# Patient Record
Sex: Female | Born: 1970
Health system: Southern US, Community
[De-identification: ages and names within clinical notes are randomized; demographics above are authoritative.]

## PROBLEM LIST (undated history)

## (undated) DIAGNOSIS — F32A Depression, unspecified: Secondary | ICD-10-CM

## (undated) DIAGNOSIS — M1990 Unspecified osteoarthritis, unspecified site: Secondary | ICD-10-CM

## (undated) DIAGNOSIS — M1610 Unilateral primary osteoarthritis, unspecified hip: Secondary | ICD-10-CM

## (undated) DIAGNOSIS — F329 Major depressive disorder, single episode, unspecified: Secondary | ICD-10-CM

## (undated) HISTORY — PX: JOINT REPLACEMENT: SHX530

## (undated) HISTORY — PX: FRACTURE SURGERY: SHX138

## (undated) HISTORY — PX: ABDOMINAL HYSTERECTOMY: SHX81

## (undated) HISTORY — PX: APPENDECTOMY: SHX54

## (undated) HISTORY — PX: TONSILLECTOMY: SUR1361

---

## 2005-05-19 ENCOUNTER — Other Ambulatory Visit: Payer: Self-pay

## 2005-05-26 ENCOUNTER — Ambulatory Visit: Payer: Self-pay | Admitting: Specialist

## 2005-06-08 ENCOUNTER — Ambulatory Visit: Payer: Self-pay | Admitting: Specialist

## 2006-11-22 ENCOUNTER — Ambulatory Visit: Payer: Self-pay | Admitting: Internal Medicine

## 2006-12-02 ENCOUNTER — Ambulatory Visit: Payer: Self-pay | Admitting: Unknown Physician Specialty

## 2006-12-22 ENCOUNTER — Ambulatory Visit: Payer: Self-pay | Admitting: Unknown Physician Specialty

## 2006-12-27 ENCOUNTER — Ambulatory Visit: Payer: Self-pay | Admitting: Unknown Physician Specialty

## 2007-02-02 ENCOUNTER — Ambulatory Visit: Payer: Self-pay | Admitting: Unknown Physician Specialty

## 2007-02-02 ENCOUNTER — Other Ambulatory Visit: Payer: Self-pay

## 2007-02-02 ENCOUNTER — Ambulatory Visit: Payer: Self-pay | Admitting: Cardiology

## 2007-02-08 ENCOUNTER — Ambulatory Visit: Payer: Self-pay | Admitting: Unknown Physician Specialty

## 2007-02-09 ENCOUNTER — Inpatient Hospital Stay: Payer: Self-pay | Admitting: Unknown Physician Specialty

## 2009-05-27 ENCOUNTER — Emergency Department: Payer: Self-pay

## 2009-08-24 ENCOUNTER — Emergency Department: Payer: Self-pay | Admitting: Emergency Medicine

## 2010-12-31 ENCOUNTER — Ambulatory Visit: Payer: Self-pay | Admitting: Orthopedic Surgery

## 2011-01-14 ENCOUNTER — Ambulatory Visit: Payer: Self-pay | Admitting: Family

## 2011-01-21 ENCOUNTER — Ambulatory Visit: Payer: Self-pay | Admitting: Family

## 2011-02-04 ENCOUNTER — Other Ambulatory Visit: Payer: Self-pay | Admitting: Orthopedic Surgery

## 2011-02-17 ENCOUNTER — Ambulatory Visit: Payer: Self-pay | Admitting: Internal Medicine

## 2011-02-20 ENCOUNTER — Ambulatory Visit: Payer: Self-pay | Admitting: Family

## 2011-03-12 ENCOUNTER — Encounter (HOSPITAL_COMMUNITY): Payer: Self-pay

## 2011-03-23 ENCOUNTER — Ambulatory Visit: Payer: Self-pay | Admitting: Family

## 2011-03-24 ENCOUNTER — Encounter (HOSPITAL_COMMUNITY): Payer: Self-pay

## 2011-03-24 ENCOUNTER — Other Ambulatory Visit: Payer: Self-pay

## 2011-03-24 ENCOUNTER — Encounter (HOSPITAL_COMMUNITY)
Admission: RE | Admit: 2011-03-24 | Discharge: 2011-03-24 | Disposition: A | Discharge status: Home / Self Care | Payer: Medicaid Other | Source: Ambulatory Visit | Attending: Orthopedic Surgery | Admitting: Orthopedic Surgery

## 2011-03-24 HISTORY — DX: Unspecified osteoarthritis, unspecified site: M19.90

## 2011-03-24 LAB — URINALYSIS, ROUTINE W REFLEX MICROSCOPIC
Ketones, ur: NEGATIVE mg/dL
Leukocytes, UA: NEGATIVE
Nitrite: NEGATIVE
Protein, ur: NEGATIVE mg/dL
Urobilinogen, UA: 0.2 mg/dL (ref 0.0–1.0)

## 2011-03-24 LAB — BASIC METABOLIC PANEL
CO2: 25 mEq/L (ref 19–32)
Calcium: 9.4 mg/dL (ref 8.4–10.5)
Creatinine, Ser: 0.81 mg/dL (ref 0.50–1.10)
GFR calc non Af Amer: 90 mL/min — ABNORMAL LOW (ref >90)
Glucose, Bld: 157 mg/dL — ABNORMAL HIGH (ref 70–99)
Sodium: 136 mEq/L (ref 135–145)

## 2011-03-24 LAB — TYPE AND SCREEN

## 2011-03-24 LAB — PROTIME-INR: Prothrombin Time: 13.9 seconds (ref 11.6–15.2)

## 2011-03-24 LAB — APTT: aPTT: 29 seconds (ref 24–37)

## 2011-03-24 LAB — CBC
MCH: 30.7 pg (ref 26.0–34.0)
MCV: 92.5 fL (ref 78.0–100.0)
Platelets: 288 10*3/uL (ref 150–400)
RBC: 4.14 MIL/uL (ref 3.87–5.11)
RDW: 13.2 % (ref 11.5–15.5)

## 2011-03-24 MED ORDER — CHLORHEXIDINE GLUCONATE 4 % EX LIQD: 60.0000 mL | Freq: Once | CUTANEOUS | Status: DC

## 2011-03-24 NOTE — Progress Notes (Signed)
Dr Corky Downs  In [place] is PCP

## 2011-03-24 NOTE — Pre-Procedure Instructions (Signed)
Jessica Richardson  03/24/2011   Your procedure is scheduled on:  03/29/11  Report to [facility] at 1100 AM.  Call this number if you have problems the morning of surgery: [phone]   Remember:   Do not eat food:After Midnight.  May have clear liquids: up to 4 Hours before arrival.  Clear liquids include soda, tea, black coffee, apple or grape juice, broth.  Take these medicines the morning of surgery with A SIP OF WATER: ultram   Do not wear jewelry, make-up or nail polish.  Do not wear lotions, powders, or perfumes. You may wear deodorant.  Do not shave 48 hours prior to surgery.  Do not bring valuables to the hospital.  Contacts, dentures or bridgework may not be worn into surgery.  Leave suitcase in the car. After surgery it may be brought to your room.  For patients admitted to the hospital, checkout time is 11:00 AM the day of discharge.   Patients discharged the day of surgery will not be allowed to drive home.  Name and phone number of your driver: family  Special Instructions: CHG Shower Use Special Wash: 1/2 bottle night before surgery and 1/2 bottle morning of surgery.   Please read over the following fact sheets that you were given: Pain Booklet, Blood Transfusion Information, MRSA Information and Surgical Site Infection Prevention

## 2011-03-29 ENCOUNTER — Encounter (HOSPITAL_COMMUNITY): Payer: Self-pay | Admitting: Anesthesiology

## 2011-03-29 ENCOUNTER — Encounter (HOSPITAL_COMMUNITY): Payer: Self-pay | Admitting: Surgery

## 2011-03-29 ENCOUNTER — Ambulatory Visit (HOSPITAL_COMMUNITY): Payer: Medicaid Other

## 2011-03-29 ENCOUNTER — Encounter (HOSPITAL_COMMUNITY)
Admission: RE | Disposition: A | Discharge status: Skilled Nursing Facility | Payer: Self-pay | Source: Ambulatory Visit | Attending: Orthopedic Surgery

## 2011-03-29 ENCOUNTER — Inpatient Hospital Stay (HOSPITAL_COMMUNITY)
Admission: RE | Admit: 2011-03-29 | Discharge: 2011-04-02 | DRG: 470 | Disposition: A | Discharge status: Skilled Nursing Facility | Payer: Medicaid Other | Source: Ambulatory Visit | Attending: Orthopedic Surgery | Admitting: Orthopedic Surgery

## 2011-03-29 ENCOUNTER — Ambulatory Visit (HOSPITAL_COMMUNITY): Payer: Medicaid Other | Admitting: Anesthesiology

## 2011-03-29 ENCOUNTER — Encounter (HOSPITAL_COMMUNITY): Payer: Self-pay | Admitting: Orthopedic Surgery

## 2011-03-29 DIAGNOSIS — Z01812 Encounter for preprocedural laboratory examination: Secondary | ICD-10-CM

## 2011-03-29 DIAGNOSIS — Z472 Encounter for removal of internal fixation device: Secondary | ICD-10-CM

## 2011-03-29 DIAGNOSIS — M169 Osteoarthritis of hip, unspecified: Principal | ICD-10-CM | POA: Diagnosis present

## 2011-03-29 DIAGNOSIS — Z01810 Encounter for preprocedural cardiovascular examination: Secondary | ICD-10-CM

## 2011-03-29 DIAGNOSIS — E119 Type 2 diabetes mellitus without complications: Secondary | ICD-10-CM | POA: Diagnosis present

## 2011-03-29 DIAGNOSIS — Z6841 Body Mass Index (BMI) 40.0 and over, adult: Secondary | ICD-10-CM

## 2011-03-29 DIAGNOSIS — M1610 Unilateral primary osteoarthritis, unspecified hip: Principal | ICD-10-CM | POA: Diagnosis present

## 2011-03-29 HISTORY — DX: Unilateral primary osteoarthritis, unspecified hip: M16.10

## 2011-03-29 HISTORY — PX: TOTAL HIP ARTHROPLASTY: SHX124

## 2011-03-29 LAB — GLUCOSE, CAPILLARY
Glucose-Capillary: 110 mg/dL — ABNORMAL HIGH (ref 70–99)
Glucose-Capillary: 165 mg/dL — ABNORMAL HIGH (ref 70–99)

## 2011-03-29 SURGERY — ARTHROPLASTY, HIP, TOTAL,POSTERIOR APPROACH
Anesthesia: General | Site: Hip | Laterality: Left | Wound class: Clean

## 2011-03-29 MED ORDER — ONDANSETRON HCL 4 MG/2ML IJ SOLN: 4.0000 mg | Freq: Four times a day (QID) | INTRAMUSCULAR | Status: DC | PRN

## 2011-03-29 MED ORDER — METHOCARBAMOL 500 MG PO TABS
500.0000 mg | ORAL_TABLET | Freq: Four times a day (QID) | ORAL | Status: DC | PRN
  Administered 2011-03-30 – 2011-04-02 (×7): 500 mg via ORAL
  Filled 2011-03-29 (×8): qty 1

## 2011-03-29 MED ORDER — WARFARIN SODIUM 10 MG PO TABS
10.0000 mg | ORAL_TABLET | Freq: Once | ORAL | Status: AC
  Administered 2011-03-29: 10 mg via ORAL
  Filled 2011-03-29: qty 1

## 2011-03-29 MED ORDER — OXYCODONE HCL 5 MG PO TABS
5.0000 mg | ORAL_TABLET | ORAL | Status: DC | PRN
  Administered 2011-03-30: 10 mg via ORAL
  Administered 2011-03-30: 5 mg via ORAL
  Administered 2011-03-30 – 2011-03-31 (×3): 10 mg via ORAL
  Filled 2011-03-29 (×5): qty 2

## 2011-03-29 MED ORDER — POTASSIUM CHLORIDE IN NACL 20-0.45 MEQ/L-% IV SOLN
INTRAVENOUS | Status: DC
  Administered 2011-03-29: 22:00:00 via INTRAVENOUS
  Filled 2011-03-29 (×9): qty 1000

## 2011-03-29 MED ORDER — ACETAMINOPHEN 650 MG RE SUPP: 650.0000 mg | Freq: Four times a day (QID) | RECTAL | Status: DC | PRN

## 2011-03-29 MED ORDER — CEFAZOLIN SODIUM-DEXTROSE 2-3 GM-% IV SOLR
INTRAVENOUS | Status: AC
  Filled 2011-03-29: qty 50

## 2011-03-29 MED ORDER — SENNOSIDES-DOCUSATE SODIUM 8.6-50 MG PO TABS
1.0000 | ORAL_TABLET | Freq: Every evening | ORAL | Status: DC | PRN
  Administered 2011-04-01: 1 via ORAL
  Filled 2011-03-29: qty 1

## 2011-03-29 MED ORDER — WARFARIN VIDEO: Freq: Once | Status: DC

## 2011-03-29 MED ORDER — MENTHOL 3 MG MT LOZG: 1.0000 | LOZENGE | OROMUCOSAL | Status: DC | PRN

## 2011-03-29 MED ORDER — SODIUM CHLORIDE 0.9 % IR SOLN
Status: DC | PRN
  Administered 2011-03-29: 1000 mL

## 2011-03-29 MED ORDER — DEXTROSE 5 % IV SOLN
500.0000 mg | INTRAVENOUS | Status: AC
  Administered 2011-03-29: 500 mg via INTRAVENOUS
  Filled 2011-03-29: qty 5

## 2011-03-29 MED ORDER — HYDROMORPHONE HCL PF 1 MG/ML IJ SOLN
INTRAMUSCULAR | Status: AC
  Filled 2011-03-29: qty 1

## 2011-03-29 MED ORDER — PROPOFOL 10 MG/ML IV EMUL
INTRAVENOUS | Status: DC | PRN
  Administered 2011-03-29: 150 mg via INTRAVENOUS

## 2011-03-29 MED ORDER — MEPERIDINE HCL 25 MG/ML IJ SOLN: 6.2500 mg | INTRAMUSCULAR | Status: DC | PRN

## 2011-03-29 MED ORDER — DIPHENHYDRAMINE HCL 12.5 MG/5ML PO ELIX
12.5000 mg | ORAL_SOLUTION | ORAL | Status: DC | PRN
  Filled 2011-03-29: qty 10

## 2011-03-29 MED ORDER — ZOLPIDEM TARTRATE 5 MG PO TABS: 5.0000 mg | ORAL_TABLET | Freq: Every evening | ORAL | Status: DC | PRN

## 2011-03-29 MED ORDER — OXYCODONE-ACETAMINOPHEN 10-325 MG PO TABS: 1.0000 | ORAL_TABLET | Freq: Four times a day (QID) | ORAL | Status: AC | PRN

## 2011-03-29 MED ORDER — VANCOMYCIN HCL IN DEXTROSE 1-5 GM/200ML-% IV SOLN
1000.0000 mg | Freq: Once | INTRAVENOUS | Status: AC
  Administered 2011-03-30: 1000 mg via INTRAVENOUS
  Filled 2011-03-29 (×2): qty 200

## 2011-03-29 MED ORDER — PATIENT'S GUIDE TO USING COUMADIN BOOK
Freq: Once | Status: AC
  Administered 2011-03-29: 21:00:00
  Filled 2011-03-29: qty 1

## 2011-03-29 MED ORDER — BISACODYL 10 MG RE SUPP: 10.0000 mg | Freq: Every day | RECTAL | Status: DC | PRN

## 2011-03-29 MED ORDER — SENNA 8.6 MG PO TABS
1.0000 | ORAL_TABLET | Freq: Two times a day (BID) | ORAL | Status: DC
  Administered 2011-03-29 – 2011-04-02 (×8): 8.6 mg via ORAL
  Filled 2011-03-29 (×9): qty 1

## 2011-03-29 MED ORDER — ONDANSETRON HCL 4 MG/2ML IJ SOLN
INTRAMUSCULAR | Status: DC | PRN
  Administered 2011-03-29: 4 mg via INTRAVENOUS

## 2011-03-29 MED ORDER — CEFAZOLIN SODIUM 1-5 GM-% IV SOLN: 1.0000 g | INTRAVENOUS | Status: DC

## 2011-03-29 MED ORDER — DEXTROSE 5 % IV SOLN
500.0000 mg | Freq: Four times a day (QID) | INTRAVENOUS | Status: DC | PRN
  Filled 2011-03-29: qty 5

## 2011-03-29 MED ORDER — VANCOMYCIN HCL 1000 MG IV SOLR
1000.0000 mg | INTRAVENOUS | Status: DC | PRN
  Administered 2011-03-29: 1000 mg via INTRAVENOUS

## 2011-03-29 MED ORDER — WARFARIN SODIUM 5 MG PO TABS: 5.0000 mg | ORAL_TABLET | Freq: Every day | ORAL | Status: DC

## 2011-03-29 MED ORDER — PHENOL 1.4 % MT LIQD
1.0000 | OROMUCOSAL | Status: DC | PRN
  Filled 2011-03-29: qty 177

## 2011-03-29 MED ORDER — ENOXAPARIN SODIUM 40 MG/0.4ML ~~LOC~~ SOLN: 40.0000 mg | SUBCUTANEOUS | Status: DC

## 2011-03-29 MED ORDER — CEFAZOLIN SODIUM-DEXTROSE 2-3 GM-% IV SOLR: 2.0000 g | INTRAVENOUS | Status: DC

## 2011-03-29 MED ORDER — OXYCODONE-ACETAMINOPHEN 5-325 MG PO TABS
1.0000 | ORAL_TABLET | ORAL | Status: DC | PRN
  Administered 2011-03-30: 1 via ORAL
  Administered 2011-03-31 – 2011-04-02 (×9): 2 via ORAL
  Filled 2011-03-29 (×5): qty 2
  Filled 2011-03-29: qty 1
  Filled 2011-03-29 (×4): qty 2

## 2011-03-29 MED ORDER — HETASTARCH-ELECTROLYTES 6 % IV SOLN
INTRAVENOUS | Status: DC | PRN
  Administered 2011-03-29: 17:00:00 via INTRAVENOUS

## 2011-03-29 MED ORDER — METHOCARBAMOL 500 MG PO TABS: 500.0000 mg | ORAL_TABLET | Freq: Four times a day (QID) | ORAL | Status: AC

## 2011-03-29 MED ORDER — NEOSTIGMINE METHYLSULFATE 1 MG/ML IJ SOLN
INTRAMUSCULAR | Status: DC | PRN
  Administered 2011-03-29: 3 mg via INTRAVENOUS

## 2011-03-29 MED ORDER — HYDROMORPHONE HCL PF 1 MG/ML IJ SOLN
0.5000 mg | INTRAMUSCULAR | Status: DC | PRN
  Administered 2011-03-29 – 2011-03-31 (×6): 1 mg via INTRAVENOUS
  Filled 2011-03-29 (×5): qty 1

## 2011-03-29 MED ORDER — MORPHINE SULFATE 2 MG/ML IJ SOLN: 0.0500 mg/kg | INTRAMUSCULAR | Status: DC | PRN

## 2011-03-29 MED ORDER — VANCOMYCIN HCL IN DEXTROSE 1-5 GM/200ML-% IV SOLN: 1000.0000 mg | Freq: Two times a day (BID) | INTRAVENOUS | Status: DC

## 2011-03-29 MED ORDER — METOCLOPRAMIDE HCL 10 MG PO TABS: 5.0000 mg | ORAL_TABLET | Freq: Three times a day (TID) | ORAL | Status: DC | PRN

## 2011-03-29 MED ORDER — METOCLOPRAMIDE HCL 5 MG/ML IJ SOLN
5.0000 mg | Freq: Three times a day (TID) | INTRAMUSCULAR | Status: DC | PRN
  Filled 2011-03-29: qty 2

## 2011-03-29 MED ORDER — ACETAMINOPHEN 325 MG PO TABS
650.0000 mg | ORAL_TABLET | Freq: Four times a day (QID) | ORAL | Status: DC | PRN
  Administered 2011-03-31: 650 mg via ORAL
  Filled 2011-03-29: qty 2

## 2011-03-29 MED ORDER — DOCUSATE SODIUM 100 MG PO CAPS
100.0000 mg | ORAL_CAPSULE | Freq: Two times a day (BID) | ORAL | Status: DC
  Administered 2011-03-29 – 2011-04-02 (×8): 100 mg via ORAL
  Filled 2011-03-29 (×11): qty 1

## 2011-03-29 MED ORDER — MIDAZOLAM HCL 5 MG/5ML IJ SOLN
INTRAMUSCULAR | Status: DC | PRN
  Administered 2011-03-29: 2 mg via INTRAVENOUS

## 2011-03-29 MED ORDER — ONDANSETRON HCL 4 MG/2ML IJ SOLN: 4.0000 mg | Freq: Once | INTRAMUSCULAR | Status: DC | PRN

## 2011-03-29 MED ORDER — ENOXAPARIN SODIUM 40 MG/0.4ML ~~LOC~~ SOLN
40.0000 mg | SUBCUTANEOUS | Status: DC
  Administered 2011-03-29 – 2011-04-01 (×4): 40 mg via SUBCUTANEOUS
  Filled 2011-03-29 (×5): qty 0.4

## 2011-03-29 MED ORDER — HYDROMORPHONE HCL PF 1 MG/ML IJ SOLN
0.2500 mg | INTRAMUSCULAR | Status: DC | PRN
  Administered 2011-03-29 (×2): 0.5 mg via INTRAVENOUS

## 2011-03-29 MED ORDER — ERGOCALCIFEROL 1.25 MG (50000 UT) PO CAPS: 50000.0000 [IU] | ORAL_CAPSULE | ORAL | Status: DC

## 2011-03-29 MED ORDER — ONDANSETRON HCL 4 MG PO TABS: 4.0000 mg | ORAL_TABLET | Freq: Four times a day (QID) | ORAL | Status: DC | PRN

## 2011-03-29 MED ORDER — ROCURONIUM BROMIDE 100 MG/10ML IV SOLN
INTRAVENOUS | Status: DC | PRN
  Administered 2011-03-29: 50 mg via INTRAVENOUS
  Administered 2011-03-29: 10 mg via INTRAVENOUS

## 2011-03-29 MED ORDER — LACTATED RINGERS IV SOLN
INTRAVENOUS | Status: DC
  Administered 2011-03-29: 13:00:00 via INTRAVENOUS

## 2011-03-29 MED ORDER — LIDOCAINE HCL (CARDIAC) 20 MG/ML IV SOLN
INTRAVENOUS | Status: DC | PRN
  Administered 2011-03-29: 60 mg via INTRAVENOUS

## 2011-03-29 MED ORDER — LACTATED RINGERS IV SOLN
INTRAVENOUS | Status: DC | PRN
  Administered 2011-03-29 (×2): via INTRAVENOUS

## 2011-03-29 MED ORDER — FENTANYL CITRATE 0.05 MG/ML IJ SOLN
INTRAMUSCULAR | Status: DC | PRN
  Administered 2011-03-29 (×2): 50 ug via INTRAVENOUS
  Administered 2011-03-29: 150 ug via INTRAVENOUS

## 2011-03-29 MED ORDER — VANCOMYCIN HCL IN DEXTROSE 1-5 GM/200ML-% IV SOLN
INTRAVENOUS | Status: AC
  Filled 2011-03-29: qty 200

## 2011-03-29 MED ORDER — BUPIVACAINE HCL 0.5 % IJ SOLN
INTRAMUSCULAR | Status: DC | PRN
  Administered 2011-03-29: 30 mL

## 2011-03-29 MED ORDER — VITAMIN D (ERGOCALCIFEROL) 1.25 MG (50000 UNIT) PO CAPS
50000.0000 [IU] | ORAL_CAPSULE | ORAL | Status: DC
  Administered 2011-03-29: 50000 [IU] via ORAL
  Filled 2011-03-29: qty 1

## 2011-03-29 MED ORDER — GLYCOPYRROLATE 0.2 MG/ML IJ SOLN
INTRAMUSCULAR | Status: DC | PRN
  Administered 2011-03-29: .4 mg via INTRAVENOUS

## 2011-03-29 SURGICAL SUPPLY — 65 items
BENZOIN TINCTURE PRP APPL 2/3 (GAUZE/BANDAGES/DRESSINGS) ×2 IMPLANT
BLADE SAW SAG 73X25 THK (BLADE) ×1
BLADE SAW SGTL 73X25 THK (BLADE) ×1 IMPLANT
BRUSH FEMORAL CANAL (MISCELLANEOUS) IMPLANT
CLOSURE STERI STRIP 1/2 X4 (GAUZE/BANDAGES/DRESSINGS) ×2 IMPLANT
CLOTH BEACON ORANGE TIMEOUT ST (SAFETY) ×2 IMPLANT
COVER BACK TABLE 24X17X13 BIG (DRAPES) IMPLANT
COVER SURGICAL LIGHT HANDLE (MISCELLANEOUS) ×2 IMPLANT
DRAPE INCISE IOBAN 66X45 STRL (DRAPES) ×4 IMPLANT
DRAPE ORTHO SPLIT 77X108 STRL (DRAPES) ×2
DRAPE PROXIMA HALF (DRAPES) ×2 IMPLANT
DRAPE SURG ORHT 6 SPLT 77X108 (DRAPES) ×2 IMPLANT
DRAPE U-SHAPE 47X51 STRL (DRAPES) ×2 IMPLANT
DRILL BIT 5/64 (BIT) ×2 IMPLANT
DRSG MEPILEX BORDER 4X12 (GAUZE/BANDAGES/DRESSINGS) ×2 IMPLANT
DRSG MEPILEX BORDER 4X8 (GAUZE/BANDAGES/DRESSINGS) ×2 IMPLANT
DRSG PAD ABDOMINAL 8X10 ST (GAUZE/BANDAGES/DRESSINGS) ×4 IMPLANT
DURAPREP 26ML APPLICATOR (WOUND CARE) ×2 IMPLANT
ELECT CAUTERY BLADE 6.4 (BLADE) ×2 IMPLANT
ELECT REM PT RETURN 9FT ADLT (ELECTROSURGICAL) ×2
ELECTRODE REM PT RTRN 9FT ADLT (ELECTROSURGICAL) ×1 IMPLANT
EVACUATOR 1/8 PVC DRAIN (DRAIN) IMPLANT
GLOVE BIOGEL PI IND STRL 8 (GLOVE) ×2 IMPLANT
GLOVE BIOGEL PI INDICATOR 8 (GLOVE) ×2
GLOVE ORTHO TXT STRL SZ7.5 (GLOVE) ×4 IMPLANT
GLOVE SURG ORTHO 8.0 STRL STRW (GLOVE) ×4 IMPLANT
GOWN STRL NON-REIN LRG LVL3 (GOWN DISPOSABLE) IMPLANT
HANDPIECE INTERPULSE COAX TIP (DISPOSABLE)
HOOD PEEL AWAY FACE SHEILD DIS (HOOD) ×2 IMPLANT
KIT BASIN OR (CUSTOM PROCEDURE TRAY) ×2 IMPLANT
KIT ROOM TURNOVER OR (KITS) ×2 IMPLANT
MANIFOLD NEPTUNE II (INSTRUMENTS) ×2 IMPLANT
NEEDLE HYPO 25GX1X1/2 BEV (NEEDLE) ×2 IMPLANT
NS IRRIG 1000ML POUR BTL (IV SOLUTION) ×2 IMPLANT
PACK TOTAL JOINT (CUSTOM PROCEDURE TRAY) ×2 IMPLANT
PAD ARMBOARD 7.5X6 YLW CONV (MISCELLANEOUS) ×4 IMPLANT
PILLOW ABDUCTION HIP (SOFTGOODS) ×2 IMPLANT
PRESSURIZER FEMORAL UNIV (MISCELLANEOUS) IMPLANT
RETRIEVER SUT HEWSON (MISCELLANEOUS) ×2 IMPLANT
SET HNDPC FAN SPRY TIP SCT (DISPOSABLE) IMPLANT
SPONGE GAUZE 4X4 12PLY (GAUZE/BANDAGES/DRESSINGS) ×2 IMPLANT
SPONGE LAP 4X18 X RAY DECT (DISPOSABLE) IMPLANT
STRIP CLOSURE SKIN 1/2X4 (GAUZE/BANDAGES/DRESSINGS) ×4 IMPLANT
SUCTION FRAZIER TIP 10 FR DISP (SUCTIONS) ×2 IMPLANT
SUT FIBERWIRE #2 38 REV NDL BL (SUTURE) ×6
SUT FIBERWIRE #2 38 T-5 BLUE (SUTURE)
SUT MNCRL AB 3-0 PS2 18 (SUTURE) ×2 IMPLANT
SUT VIC AB 0 CT1 18XCR BRD 8 (SUTURE) ×2 IMPLANT
SUT VIC AB 0 CT1 27 (SUTURE) ×1
SUT VIC AB 0 CT1 27XBRD ANBCTR (SUTURE) ×1 IMPLANT
SUT VIC AB 0 CT1 8-18 (SUTURE) ×2
SUT VIC AB 1 CT1 27 (SUTURE) ×1
SUT VIC AB 1 CT1 27XBRD ANBCTR (SUTURE) ×1 IMPLANT
SUT VIC AB 2-0 CT1 18 (SUTURE) ×4 IMPLANT
SUT VIC AB 2-0 CT1 27 (SUTURE) ×1
SUT VIC AB 2-0 CT1 TAPERPNT 27 (SUTURE) ×1 IMPLANT
SUT VIC AB 3-0 SH 8-18 (SUTURE) ×6 IMPLANT
SUTURE FIBERWR #2 38 T-5 BLUE (SUTURE) IMPLANT
SUTURE FIBERWR#2 38 REV NDL BL (SUTURE) ×3 IMPLANT
SYR CONTROL 10ML LL (SYRINGE) ×2 IMPLANT
TOWEL OR 17X24 6PK STRL BLUE (TOWEL DISPOSABLE) ×2 IMPLANT
TOWEL OR 17X26 10 PK STRL BLUE (TOWEL DISPOSABLE) ×2 IMPLANT
TOWER CARTRIDGE SMART MIX (DISPOSABLE) IMPLANT
TRAY FOLEY CATH 14FR (SET/KITS/TRAYS/PACK) ×2 IMPLANT
WATER STERILE IRR 1000ML POUR (IV SOLUTION) ×8 IMPLANT

## 2011-03-29 NOTE — Preoperative (Signed)
Beta Blockers   Reason not to administer Beta Blockers:Not Applicable 

## 2011-03-29 NOTE — Anesthesia Postprocedure Evaluation (Signed)
  Anesthesia Post-op Note  Patient: Jessica Richardson  Procedure(s) Performed:  TOTAL HIP ARTHROPLASTY - with removal of retained hardware (knowles pins)  Patient Location: PACU  Anesthesia Type: General  Level of Consciousness: awake and alert   Airway and Oxygen Therapy: Patient connected to nasal cannula oxygen  Post-op Pain: moderate  Post-op Assessment: Post-op Vital signs reviewed and Patient's Cardiovascular Status Stable  Post-op Vital Signs: stable  Complications: No apparent anesthesia complications

## 2011-03-29 NOTE — H&P (Signed)
  PREOPERATIVE H&P  Chief Complaint: DJD Left Hip Retained Hardware   HPI: Jessica Richardson is a 41 y.o. female who presents for preoperative history and physical with a diagnosis of DJD Left Hip and Retained Hardware . Symptoms are rated as moderate to severe, and have been worsening.  This is significantly impairing activities of daily living.  She has elected for surgical management. She previously had a slipped capital femoral epiphysis, with internal fixation using Knowles pins when she was young girl. She has had progressive deformity and dysfunction with progressive loss of function of her left hip. She wished for total hip arthroplasty.  Past Medical History  Diagnosis Date  . Arthritis   . Diabetes mellitus     diet conttrolled  only,no meds ever   Past Surgical History  Procedure Date  . Abdominal hysterectomy   . Appendectomy   . Tonsillectomy   . Cesarean section   . Joint replacement     pins both hips   History   Social History  . Marital Status: Single    Spouse Name: N/A    Number of Children: N/A  . Years of Education: N/A   Social History Main Topics  . Smoking status: Former Smoker    Quit date: 03/24/2011  . Smokeless tobacco: None  . Alcohol Use: Yes     rarely  . Drug Use: No  . Sexually Active: No   Other Topics Concern  . None   Social History Narrative  . None   History reviewed. No pertinent family history. Allergies  Allergen Reactions  . Penicillins Hives   Prior to Admission medications   Medication Sig Start Date End Date Taking? Authorizing Provider  ergocalciferol (VITAMIN D2) 50000 UNITS capsule Take 50,000 Units by mouth every 30 (thirty) days.     Yes Historical Provider, MD  meloxicam (MOBIC) 15 MG tablet Take 15 mg by mouth daily.     Yes Historical Provider, MD  traMADol (ULTRAM) 50 MG tablet Take 50 mg by mouth daily as needed. For pain. Maximum dose= 8 tablets per day    Yes Historical Provider, MD     Positive ROS: All  other systems have been reviewed and were otherwise negative with the exception of those mentioned in the HPI and as above.  Physical Exam: General: Alert, no acute distress, obese Cardiovascular: No pedal edema Respiratory: No cyanosis, no use of accessory musculature GI: No organomegaly, abdomen is soft and non-tender Skin: No lesions in the area of chief complaint Neurologic: Sensation intact distally Psychiatric: Patient is competent for consent with normal mood and affect Lymphatic: No axillary or cervical lymphadenopathy  MUSCULOSKELETAL: Left hip has severe limitation with range of motion. Her previous surgical wounds have healed. She has reproduction of her pain with passive motion of her hip. She has normal no internal or external rotation.  Assessment: DJD Left Hip Retained Hardware   Plan: Plan for Procedure(s): TOTAL HIP ARTHROPLASTY with removal of hardware  The risks benefits and alternatives were discussed with the patient including but not limited to the risks of nonoperative treatment, versus surgical intervention including infection, bleeding, nerve injury, periprosthetic fracture, the need for revision surgery, dislocation, leg length discrepancy, blood clots, cardiopulmonary complications, morbidity, mortality, among others, and they were willing to proceed.  Predicted outcome is good, although there will be at least a six to nine month expected recovery.    Corrado Hymon P 03/29/2011 1:50 PM

## 2011-03-29 NOTE — Anesthesia Procedure Notes (Addendum)
Procedure Name: Intubation Date/Time: 03/29/2011 2:08 PM Performed by: Caryn Bee Pre-anesthesia Checklist: Patient identified, Emergency Drugs available, Suction available, Patient being monitored and Timeout performed Patient Re-evaluated:Patient Re-evaluated prior to inductionOxygen Delivery Method: Circle System Utilized Preoxygenation: Pre-oxygenation with 100% oxygen Intubation Type: IV induction Ventilation: Oral airway inserted - appropriate to patient size and Mask ventilation without difficulty Laryngoscope Size: Miller and 2 Grade View: Grade II Tube type: Oral Tube size: 7.5 mm Number of attempts: 1 Airway Equipment and Method: stylet Placement Confirmation: ETT inserted through vocal cords under direct vision,  positive ETCO2 and breath sounds checked- equal and bilateral Secured at: 23 cm Tube secured with: Tape Dental Injury: Teeth and Oropharynx as per pre-operative assessment  Comments: Intubation per Elvina Sidle, SRNA.

## 2011-03-29 NOTE — Progress Notes (Signed)
ANTICOAGULATION CONSULT NOTE - Initial Consult  Pharmacy Consult for Coumadin Indication: VTE prophylaxis s/p hip  Allergies  Allergen Reactions  . Penicillins Hives    Patient Measurements: Height: 5\' 5"  (165.1 cm) Weight: 319 lb 10.7 oz (145 kg) IBW/kg (Calculated) : 57  Adjusted Body Weight: Vital Signs: Temp: 97.7 F (36.5 C) (01/07 1949) Temp src: Oral (01/07 1119) BP: 133/73 mmHg (01/07 1949) Pulse Rate: 82  (01/07 1949)  Labs: No results found for this basename: HGB:2,HCT:3,PLT:3,APTT:3,LABPROT:3,INR:3,HEPARINUNFRC:3,CREATININE:3,CKTOTAL:3,CKMB:3,TROPONINI:3 in the last 72 hours Estimated Creatinine Clearance: 134.4 ml/min (by C-G formula based on Cr of 0.81).  Medical History: Past Medical History  Diagnosis Date  . Arthritis   . Diabetes mellitus     diet conttrolled  only,no meds ever  . Hip arthritis, left, post-SCFE 03/29/2011    Medications:  Scheduled:    . ceFAZolin      . docusate sodium  100 mg Oral BID  . enoxaparin  40 mg Subcutaneous Q24H  . ergocalciferol  50,000 Units Oral Q30 days  . HYDROmorphone      . methocarbamol(ROBAXIN) IV  500 mg Intravenous To PACU  . senna  1 tablet Oral BID  . vancomycin  1,000 mg Intravenous Once  . DISCONTD: ceFAZolin (ANCEF) IV  1 g Intravenous 60 min Pre-Op  . DISCONTD: ceFAZolin (ANCEF) IV  2 g Intravenous 60 min Pre-Op  . DISCONTD: vancomycin  1,000 mg Intravenous Q12H    Assessment: 41 year old starting Coumadin for VTE prophylaxis  Goal of Therapy:  INR 2-3   Plan:  1) Coumadin 10 mg po x 1 dose tonight 2) Daily PT / INR 3) Coumadin education  Thank you.  Jessica Richardson 03/29/2011,8:31 PM

## 2011-03-29 NOTE — Progress Notes (Signed)
Dr. Dion Saucier notified of hives with penicillin. Stated to do test dose with ancef.

## 2011-03-29 NOTE — Transfer of Care (Signed)
Immediate Anesthesia Transfer of Care Note  Patient: Jessica Richardson  Procedure(s) Performed:  TOTAL HIP ARTHROPLASTY - with removal of retained hardware (knowles pins)  Patient Location: PACU  Anesthesia Type: General  Level of Consciousness: awake, alert , oriented and patient cooperative  Airway & Oxygen Therapy: Patient Spontanous Breathing and Patient connected to nasal cannula oxygen  Post-op Assessment: Report given to PACU RN, Post -op Vital signs reviewed and stable and Patient moving all extremities  Post vital signs: Reviewed and stable  Complications: No apparent anesthesia complications

## 2011-03-29 NOTE — Op Note (Signed)
03/29/2011  5:05 PM  PATIENT:  Jessica Richardson   MRN: [id number]  PRE-OPERATIVE DIAGNOSIS:  degenerative joint disease Left Hip, with Retained Hardware , status post slipped lateral femoral epiphysis  POST-OPERATIVE DIAGNOSIS:  degenerative joint disease left hip, with retained hardware, status post slipped capital femoral epiphysis  PROCEDURE:  Procedure(s): TOTAL HIP ARTHROPLASTY left, with removal of 5 knowles pins  PREOPERATIVE INDICATIONS:    Jessica Richardson is an 41 y.o. female who has a diagnosis of Hip arthritis and elected for surgical management after failing conservative treatment.  The risks benefits and alternatives were discussed with the patient including but not limited to the risks of nonoperative treatment, versus surgical intervention including infection, bleeding, nerve injury, periprosthetic fracture, the need for revision surgery, dislocation, leg length discrepancy, blood clots, cardiopulmonary complications, morbidity, mortality, among others, and they were willing to proceed.  Predicted outcome is good, although there will be at least a six to nine month expected recovery.   OPERATIVE REPORT     SURGEON:  Teryl Lucy, MD    ASSISTANT:  Janace Litten, OPA-C  (Present throughout the entire procedure,  necessary for completion of procedure in a timely manner, assisting with retraction, instrumentation, and closure)     ANESTHESIA:  General    COMPLICATIONS:  None.     COMPONENTS:  [employer] fit femur size 4 with a 36 +1.5 mm head ball and a pinnacle acetabular shell size 52 with a 10 degree lipped polyethylene liner    PROCEDURE IN DETAIL:   The patient was met in the holding area and  identified.  The appropriate hip was identified and marked at the operative site.  The patient was then transported to the OR  and  placed under general l anesthesia.  At that point, the patient was  placed in the lateral decubitus position with the operative side up and   secured to the operating room table and all bony prominences padded.     The operative lower extremity was prepped from the iliac crest to the distal leg.  Sterile draping was performed.  Time out was performed prior to incision.      A routine posterolateral approach was utilized via sharp dissection  carried down to the subcutaneous tissue.  Gross bleeders were Bovie coagulated.  The iliotibial band was identified and incised along the length of the skin incision.  Self-retaining retractors were  inserted.    A osteotome was used to uncover the Knowles pins, and these were removed. This was fairly difficult, and there was substantial bony overgrowth. Nonetheless I was able to remove all of the pins, and I replaced 1 pain in order to help stabilize the neck during dislocation.  With the hip internally rotated, the short external rotators  were identified. The piriformis and capsule was tagged with FiberWire, and the hip capsule released in a T-type fashion.  The femoral neck was exposed, I removed the Knowles pin, and I resected the femoral neck using the appropriate jig. This was performed at approximately a one half thumb's breadth above the lesser trochanter. I made this cut as proximal as possible, and I had no neck on the head segment left, but the neck itself was extremely short secondary to her deformity.    I then exposed the deep acetabulum, cleared out any tissue including the ligamentum teres.  A wing retractor was placed.  After adequate visualization, I excised the labrum, and then sequentially reamed.  I placed the  trial acetabulum, which seated nicely, and then impacted the real cup into place.  Appropriate version and inclination was confirmed clinically matching their bony anatomy, and also with the use of the jig.  A trial polyethylene liner was placed and the wing retractor removed.    I then prepared the proximal femur using the cookie-cutter, the lateralizing reamer, and then  sequentially reamed and broached.  A trial broach, neck, and head was utilized, and I reduced the hip and it was found to have excellent stability with functional range of motion. The trial components were then removed, and the real polyethylene liner was placed with the lip directed posteriorly.  I then impacted the real femoral prosthesis into place into the appropriate version, slightly anteverted to the normal anatomy, and I impacted the real head ball into place. The hip was then reduced and taken through functional range of motion and found to have excellent stability. Leg lengths were restored.  I then used a 2 mm drill bits to pass the FiberWire suture from the capsule and puriform is through the greater trochanter, and secured this. Excellent posterior capsular repair was achieved. I also closed the T in the capsule. The area over the Knowles pins on the vastus lateralis was repaired with Vicryl.  I then irrigated the hip copiously again with pulse lavage, and repaired the fascia with Vicryl, followed by Vicryl for the subcutaneous tissue, Monocryl for the skin, Steri-Strips and sterile gauze. The wounds were injected. The patient was then awakened and returned to PACU in stable and satisfactory condition. There were no complications.  Teryl Lucy, MD Orthopedic Surgeon [phone]   03/29/2011 5:05 PM

## 2011-03-29 NOTE — Anesthesia Preprocedure Evaluation (Addendum)
Anesthesia Evaluation  Patient identified by MRN, date of birth, ID band Patient awake    Reviewed: Allergy & Precautions, H&P , NPO status , Patient's Chart, lab work & pertinent test results  Airway Mallampati: I TM Distance: >3 FB Neck ROM: Full    Dental  (+) Edentulous Upper and Dental Advisory Given   Pulmonary  clear to auscultation        Cardiovascular Regular Normal    Neuro/Psych    GI/Hepatic   Endo/Other  Morbid obesity  Renal/GU      Musculoskeletal   Abdominal   Peds  Hematology   Anesthesia Other Findings   Reproductive/Obstetrics                           Anesthesia Physical Anesthesia Plan  ASA: III  Anesthesia Plan: General   Post-op Pain Management:    Induction: Intravenous  Airway Management Planned: Oral ETT  Additional Equipment:   Intra-op Plan:   Post-operative Plan: Extubation in OR  Informed Consent: I have reviewed the patients History and Physical, chart, labs and discussed the procedure including the risks, benefits and alternatives for the proposed anesthesia with the patient or authorized representative who has indicated his/her understanding and acceptance.   Dental advisory given  Plan Discussed with: CRNA, Anesthesiologist and Surgeon  Anesthesia Plan Comments:         Anesthesia Quick Evaluation

## 2011-03-30 ENCOUNTER — Encounter (HOSPITAL_COMMUNITY): Payer: Self-pay | Admitting: Orthopedic Surgery

## 2011-03-30 LAB — GLUCOSE, CAPILLARY

## 2011-03-30 LAB — CBC
Hemoglobin: 9.9 g/dL — ABNORMAL LOW (ref 12.0–15.0)
MCH: 30.4 pg (ref 26.0–34.0)
MCHC: 32.4 g/dL (ref 30.0–36.0)
MCV: 93.9 fL (ref 78.0–100.0)
RBC: 3.26 MIL/uL — ABNORMAL LOW (ref 3.87–5.11)

## 2011-03-30 LAB — BASIC METABOLIC PANEL
BUN: 10 mg/dL (ref 6–23)
CO2: 25 mEq/L (ref 19–32)
Calcium: 8.2 mg/dL — ABNORMAL LOW (ref 8.4–10.5)
Creatinine, Ser: 0.63 mg/dL (ref 0.50–1.10)
GFR calc non Af Amer: >90 mL/min (ref >90)
Glucose, Bld: 109 mg/dL — ABNORMAL HIGH (ref 70–99)

## 2011-03-30 MED ORDER — MUPIROCIN 2 % EX OINT
TOPICAL_OINTMENT | Freq: Two times a day (BID) | CUTANEOUS | Status: DC
  Administered 2011-03-30 – 2011-04-02 (×8): via NASAL
  Filled 2011-03-30: qty 22

## 2011-03-30 MED ORDER — WARFARIN SODIUM 10 MG PO TABS
10.0000 mg | ORAL_TABLET | Freq: Once | ORAL | Status: AC
  Administered 2011-03-30: 10 mg via ORAL
  Filled 2011-03-30: qty 1

## 2011-03-30 MED ORDER — CHLORHEXIDINE GLUCONATE CLOTH 2 % EX PADS
6.0000 | MEDICATED_PAD | Freq: Every day | CUTANEOUS | Status: DC
  Administered 2011-03-30 – 2011-04-02 (×2): 6 via TOPICAL

## 2011-03-30 MED ORDER — INSULIN ASPART 100 UNIT/ML ~~LOC~~ SOLN
0.0000 [IU] | Freq: Three times a day (TID) | SUBCUTANEOUS | Status: DC
  Administered 2011-03-30 – 2011-04-01 (×5): 2 [IU] via SUBCUTANEOUS
  Administered 2011-04-01: 3 [IU] via SUBCUTANEOUS
  Administered 2011-04-02 (×2): 2 [IU] via SUBCUTANEOUS
  Filled 2011-03-30: qty 3

## 2011-03-30 NOTE — Progress Notes (Signed)
Physical Therapy Treatment Patient Details Name: Jessica Richardson MRN: [id number] DOB: 1970/10/02 Today's Date: 03/30/2011  PT Assessment/Plan  PT - Assessment/Plan Comments on Treatment Session: Pt moving very slowly this afternoon, requiring encouragement for initiating movement, but overall moves well once up from sitting.  PT Plan: Discharge plan remains appropriate PT Frequency: 7X/week Follow Up Recommendations: Skilled nursing facility Equipment Recommended: None recommended by PT;Defer to next venue PT Goals  Acute Rehab PT Goals PT Goal Formulation: With patient/family Time For Goal Achievement: 7 days Pt will go Supine/Side to Sit: with min assist PT Goal: Supine/Side to Sit - Progress:  (Not addressed) Pt will go Sit to Supine/Side: with min assist PT Goal: Sit to Supine/Side - Progress: Progressing toward goal Pt will go Sit to Stand: with min assist;with upper extremity assist PT Goal: Sit to Stand - Progress: Progressing toward goal Pt will go Stand to Sit: with min assist;with upper extremity assist PT Goal: Stand to Sit - Progress: Progressing toward goal Pt will Ambulate: with min assist;with rolling walker;16 - 50 feet (62ft, 25% weight bearing) PT Goal: Ambulate - Progress: Progressing toward goal Pt will Perform Home Exercise Program: with supervision, verbal cues required/provided PT Goal: Perform Home Exercise Program - Progress: Progressing toward goal  PT Treatment Precautions/Restrictions  Precautions Precautions: Posterior Hip Precaution Booklet Issued: No Precaution Comments: Pt able to recall 1/3 precautiond and WB status Required Braces or Orthoses: No Restrictions Weight Bearing Restrictions: Yes LLE Weight Bearing: Partial weight bearing LLE Partial Weight Bearing Percentage or Pounds: 25% Mobility (including Balance) Bed Mobility Bed Mobility: Yes Sit to Supine: 2: Max assist;HOB flat;With rail Sit to Supine - Details (indicate cue type and  reason): Cues for sequence and technique, A for bil LEs.  Transfers Transfers: Yes Sit to Stand: 1: +2 Total assist;Patient percentage (comment);From chair/3-in-1;With upper extremity assist (Pt 40%) Sit to Stand Details (indicate cue type and reason): Cues for scooting edge of chair and for initiation, increased time due to pain Stand to Sit: 3: Mod assist;To chair/3-in-1;With upper extremity assist;To bed Stand to Sit Details: Cues for controling descent Ambulation/Gait Ambulation/Gait: Yes Ambulation/Gait Assistance: 3: Mod assist Ambulation/Gait Assistance Details (indicate cue type and reason): Cues for posture,A for steadying Ambulation Distance (Feet): 20 Feet Assistive device: Rolling walker Gait Pattern: Step-to pattern;Decreased stance time - left;Decreased step length - right;Decreased weight shift to left Stairs: No    Exercise  Total Joint Exercises Ankle Circles/Pumps: 10 reps;Seated;Both;Strengthening;AROM Gluteal Sets: 10 reps;Supine;Both;Strengthening;AROM Heel Slides: 10 reps;Supine;Left;Strengthening;AAROM Hip ABduction/ADduction: Supine;10 reps;Left;Strengthening;AAROM Long Arc Quad: Seated;10 reps;Left;Strengthening;AROM End of Session PT - End of Session Equipment Utilized During Treatment: Gait belt Activity Tolerance: Patient limited by pain Patient left: in chair;with call bell in reach;with family/visitor present Nurse Communication: Mobility status for transfers;Mobility status for ambulation;Weight bearing status General Behavior During Session: [facility] for tasks performed Cognition: [facility] for tasks performed  [facility] [place], [place] [phone]  03/30/2011, 2:42 PM

## 2011-03-30 NOTE — Progress Notes (Signed)
Utilization review completed. Harutyun Monteverde, RN, BSN. 03/30/11  

## 2011-03-30 NOTE — Progress Notes (Signed)
Occupational Therapy Evaluation Patient Details Name: Jessica Richardson MRN: [id number] DOB: 1970/05/06 Today's Date: 03/30/2011  Problem List:  Patient Active Problem List  Diagnoses  . Hip arthritis, left, post-SCFE    Past Medical History:  Past Medical History  Diagnosis Date  . Arthritis   . Diabetes mellitus     diet conttrolled  only,no meds ever  . Hip arthritis, left, post-SCFE 03/29/2011   Past Surgical History:  Past Surgical History  Procedure Date  . Abdominal hysterectomy   . Appendectomy   . Tonsillectomy   . Cesarean section   . Joint replacement     pins both hips  . Total hip arthroplasty 03/29/2011    Procedure: TOTAL HIP ARTHROPLASTY;  Surgeon: Eulas Post;  Location: [facility] OR;  Service: Orthopedics;  Laterality: Left;  with removal of retained hardware (knowles pins)    OT Assessment/Plan/Recommendation OT Assessment Clinical Impression Statement: Pt s/p L THA. Pt will benefit from skilled OT services to maximize independence with ADL and functional mobility for ADL with AE and ADE following THP (post) and PWB status of 24% to facilitate D/C to next venue of care. Pt states that she is going to rehab at [facility]. OT Recommendation/Assessment: Patient will need skilled OT in the acute care venue OT Problem List: Decreased strength;Decreased range of motion;Decreased knowledge of use of DME or AE;Decreased knowledge of precautions;Pain Barriers to Discharge: Decreased caregiver support;Inaccessible home environment OT Therapy Diagnosis : Generalized weakness OT Plan OT Frequency: Min 1X/week OT Treatment/Interventions: Self-care/ADL training;Energy conservation;DME and/or AE instruction;Therapeutic activities;Patient/family education OT Recommendation Follow Up Recommendations: Skilled nursing facility Equipment Recommended: Defer to next venue Individuals Consulted Consulted and Agree with Results and Recommendations: Patient;Family member/caregiver OT  Goals Acute Rehab OT Goals OT Goal Formulation: With patient Time For Goal Achievement: 7 days ADL Goals Pt Will Perform Lower Body Bathing: with supervision;Sit to stand from bed;with adaptive equipment;with cueing (comment type and amount) ADL Goal: Lower Body Bathing - Progress: Not met Pt Will Perform Lower Body Dressing: with supervision;Sit to stand from bed;with adaptive equipment;with cueing (comment type and amount) ADL Goal: Lower Body Dressing - Progress: Not met Pt Will Transfer to Toilet: with supervision;with DME;Extra wide 3-in-1;Maintaining hip precautions;with cueing (comment type and amount) ADL Goal: Toilet Transfer - Progress: Not met Pt Will Perform Toileting - Clothing Manipulation: Independently ADL Goal: Toileting - Clothing Manipulation - Progress: Met Pt Will Perform Toileting - Hygiene: Independently ADL Goal: Toileting - Hygiene - Progress: Met Miscellaneous OT Goals Miscellaneous OT Goal #1: State 3/3 THP independently  OT Goal: Miscellaneous Goal #1 - Progress: Progressing toward goals  OT Evaluation Precautions/Restrictions  Precautions Precautions: Posterior Hip Precaution Booklet Issued: No Precaution Comments: Pt able to recall 1/3 precautiond and WB status Required Braces or Orthoses: No Restrictions Weight Bearing Restrictions: Yes LLE Weight Bearing: Partial weight bearing LLE Partial Weight Bearing Percentage or Pounds: 25% Prior Functioning Home Living Lives With: Sheran Spine Help From: Family Type of Home: House Home Layout: One level Home Access: Stairs to enter Entrance Stairs-Rails: None Entrance Stairs-Number of Steps: 3 Bathroom Shower/Tub: Engineer, manufacturing systems: Standard Bathroom Accessibility: No Home Adaptive Equipment: Bedside commode/3-in-1;Walker - rolling Prior Function Level of Independence: Independent with basic ADLs;Independent with homemaking with ambulation;Independent with gait;Independent with  transfers Able to Take Stairs?: Yes Driving: Yes ADL ADL Eating/Feeding: Independent Grooming: Set up Where Assessed - Grooming: Sitting, bed Upper Body Bathing: Chest;Right arm;Left arm;Abdomen;Simulated;Set up Where Assessed - Upper Body Bathing: Sitting, bed Lower Body  Bathing: Maximal assistance;Simulated;Performed Where Assessed - Lower Body Bathing: Sitting, chair Upper Body Dressing: Set up Where Assessed - Upper Body Dressing: Sitting, bed Lower Body Dressing: Performed;Moderate assistance Where Assessed - Lower Body Dressing: Sit to stand from bed Toilet Transfer: Minimal assistance;Performed Toilet Transfer Method: Stand pivot;Ambulating Acupuncturist: Extra wide bedside commode;Drop arm bedside commode Toileting - Clothing Manipulation: Independent Where Assessed - Glass blower/designer Manipulation: Standing Toileting - Hygiene: Independent Where Assessed - Toileting Hygiene: Standing Tub/Shower Transfer: Not assessed Equipment Used: Long-handled shoe horn;Long-handled sponge;Reacher;Rolling walker;Sock aid ADL Comments: Min A   Cognition Cognition Overall Cognitive Status: Appears within functional limits for tasks assessed Orientation Level: Oriented X4   Extremity Assessment RUE Assessment RUE Assessment: Within Functional Limits LUE Assessment LUE Assessment: Within Functional Limits Mobility  Bed Mobility Bed Mobility: Yes Sit to Supine: 3: Mod assist Transfers Transfers: Yes Sit to Stand: 4: Min assist;With armrests;From bed;From chair/3-in-1 Stand to Sit: 4: Min assist;With upper extremity assist;To bed;To chair/3-in-1   End of Session OT - End of Session Equipment Utilized During Treatment: Gait belt Activity Tolerance: Patient tolerated treatment well Patient left: in bed;with call bell in reach;with family/visitor present Nurse Communication: Mobility status for transfers;Weight bearing status General Behavior During Session: [facility] for  tasks performed Cognition: [facility] for tasks performed   [facility] 03/30/2011, 7:32 PM  [facility], OTR/L  [phone] 03/30/2011

## 2011-03-30 NOTE — Progress Notes (Signed)
Physical Therapy Evaluation Patient Details Name: Jessica Richardson MRN: [id number] DOB: 04-25-1970 Today's Date: 03/30/2011  Problem List:  Patient Active Problem List  Diagnoses  . Hip arthritis, left, post-SCFE    Past Medical History:  Past Medical History  Diagnosis Date  . Arthritis   . Diabetes mellitus     diet conttrolled  only,no meds ever  . Hip arthritis, left, post-SCFE 03/29/2011   Past Surgical History:  Past Surgical History  Procedure Date  . Abdominal hysterectomy   . Appendectomy   . Tonsillectomy   . Cesarean section   . 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 Pt currently with cute pain, decreased strength, and ROM, limiting gait and functional mobility. Pt will benefit from skilled PT to address these deficits and reduce falls risk and caregiver burden at next venue of care for eventual return home.   PT Recommendation/Assessment: Patient will need skilled PT in the acute care venue PT Problem List: Decreased strength;Decreased range of motion;Decreased activity tolerance;Decreased balance;Decreased mobility;Decreased knowledge of use of DME;Decreased safety awareness;Decreased knowledge of precautions;Pain;Obesity Barriers to Discharge: Inaccessible home environment PT Therapy Diagnosis : Difficulty walking;Acute pain PT Plan PT Frequency: 7X/week PT Treatment/Interventions: DME instruction;Gait training;Stair training;Functional mobility training;Therapeutic activities;Therapeutic exercise;Balance training;Patient/family education PT Recommendation Follow Up Recommendations: Skilled nursing facility Equipment Recommended: None recommended by PT  PT Goals  Acute Rehab PT Goals PT Goal Formulation: With patient/family Time For Goal Achievement: 7 days Pt will go Supine/Side to Sit: with min assist PT Goal:  Supine/Side to Sit - Progress:  (Goal set) Pt will go Sit to Supine/Side: with min assist PT Goal: Sit to Supine/Side - Progress:  (Goal set) Pt will go Sit to Stand: with min assist;with upper extremity assist PT Goal: Sit to Stand - Progress: Progressing toward goal Pt will go Stand to Sit: with min assist;with upper extremity assist PT Goal: Stand to Sit - Progress: Progressing toward goal Pt will Ambulate: with min assist;with rolling walker;16 - 50 feet (65ft, 25% weight bearing) PT Goal: Ambulate - Progress: Progressing toward goal Pt will Perform Home Exercise Program: with supervision, verbal cues required/provided PT Goal: Perform Home Exercise Program - Progress: Progressing toward goal  PT Evaluation Precautions/Restrictions  Precautions Precautions: Posterior Hip Precaution Booklet Issued: No Precaution Comments: Educated pt on 3/3 hip precautions Required Braces or Orthoses: No Restrictions Weight Bearing Restrictions: Yes LLE Weight Bearing: Partial weight bearing LLE Partial Weight Bearing Percentage or Pounds: 25% Prior Functioning  Home Living Lives With: Son (2 sons, 43 and 18 yrs old) Receives Help From: Family Type of Home: House Home Layout: One level Home Access: Stairs to enter Entrance Stairs-Rails: None Secretary/administrator of Steps: 3 Bathroom Shower/Tub: Engineer, manufacturing systems: Standard Bathroom Accessibility: No Home Adaptive Equipment: Bedside commode/3-in-1;Walker - rolling Prior Function Level of Independence: Independent with basic ADLs;Independent with homemaking with ambulation;Independent with gait;Independent with transfers Able to Take Stairs?: Yes Driving: Yes Vocation:  (Does not work) Producer, television/film/video: Awake/alert Overall Cognitive Status: Appears within functional limits for tasks assessed Orientation Level: Oriented X4 Sensation/Coordination Sensation Light Touch: Appears Intact Extremity  Assessment RUE Assessment RUE Assessment: Within Functional Limits LUE Assessment LUE Assessment: Within Functional Limits RLE Assessment RLE Assessment: Within Functional Limits LLE Assessment LLE Assessment: Exceptions to [facility] (NT, but consistent with THA, at least 3+/5 throughout) Mobility (including Balance) Bed Mobility  Bed Mobility: No (Pt up EOB upon arrival) Transfers Transfers: Yes Sit to Stand: 1: +2 Total assist;Patient percentage (comment);With upper extremity assist;From bed;From elevated surface (Pt 60%) Sit to Stand Details (indicate cue type and reason): Cues for hand placement, 25% WB, and sequence Stand to Sit: With upper extremity assist;With armrests;To chair/3-in-1;1: +2 Total assist Stand to Sit Details: Cues to bring walker all the way to chair and not sit until feels chair with legs, Pt 60% Ambulation/Gait Ambulation/Gait: Yes Ambulation/Gait Assistance: 1: +2 Total assist;Patient percentage (comment) (Pt 75%) Ambulation/Gait Assistance Details (indicate cue type and reason): Cues for 25%WB, gait pattern, and A for steadying and stabilizing RW Ambulation Distance (Feet): 5 Feet Assistive device: Rolling walker Gait Pattern: Step-to pattern;Decreased stance time - left;Decreased step length - right;Decreased weight shift to left Stairs: No    Exercise  Total Joint Exercises Ankle Circles/Pumps: 10 reps;Seated;Both;Strengthening;AROM Long Arc Quad: Seated;10 reps;Left;Strengthening;AROM End of Session PT - End of Session Equipment Utilized During Treatment: Gait belt Activity Tolerance: Patient limited by pain Patient left: in chair;with call bell in reach;with family/visitor present Nurse Communication: Mobility status for transfers;Mobility status for ambulation;Weight bearing status General Behavior During Session: [facility] for tasks performed Cognition: [facility] for tasks performed  [facility] [place], [place] [phone]  03/30/2011, 11:32 AM

## 2011-03-30 NOTE — Progress Notes (Addendum)
Patient ID: Jessica Richardson, female   DOB: 1970-07-25, 41 y.o.   MRN: [id number]  Procedure(s) (LRB): TOTAL HIP ARTHROPLASTY (Left) 1 Day Post-Op   Subjective:  Patient reports pain as moderate.  She says her back is hurting her some. She is otherwise doing reasonably well.  Objective:   VITALS:  BP 110/46  Pulse 85  Temp(Src) 98.1 F (36.7 C) (Oral)  Resp 20  Ht 5\' 5"  (1.651 m)  Wt 145 kg (319 lb 10.7 oz)  BMI 53.20 kg/m2  SpO2 100%  Neurologically intact ABD soft Dorsiflexion/Plantar flexion intact Incision: dressing C/D/I  LABS Lab Results  Component Value Date   HGB 12.7 03/24/2011   Lab Results  Component Value Date   WBC 5.5 03/24/2011   PLT 288 03/24/2011   Lab Results  Component Value Date   INR 1.05 03/24/2011   Lab Results  Component Value Date   NA 136 03/24/2011   K 4.2 03/24/2011   CL 102 03/24/2011   CO2 25 03/24/2011   BUN 12 03/24/2011   CREATININE 0.81 03/24/2011   GLUCOSE 157* 03/24/2011    Assessment/Plan: Principal Problem:  *Hip arthritis, left, post-SCFE   Advance diet Up with therapy She has MRSA colonization, and is receiving perioperative vancomycin. I'm also giving her Lovenox, transitioning to Coumadin for DVT prophylaxis. Given the fact that there were 5 Knowles pins in her left hip, that were removed, this has left somewhat of a weak spot in the proximal femur, and given her morbid obesity, I am therefore going to limit her weightbearing to 25% weightbearing on the left side in order to optimize the press-fit healing and minimize the risk for subsidence and fracture of the left femur. She will likely have her catheter removed this morning, based on protocol, and repeat labs today are pending. She will begin physical therapy and occupational therapy today, and I am anticipating discharge home, although given her morbid obesity she may need placement depending on her recovery capacity and ambulatory independence.  She has indicated that she is  diet-controlled diabetic, although her blood sugars are in the 160 range right now, and I will initiate a sliding scale as well as blood sugar checks per protocol.  Kekoa Fyock P 03/30/2011, 5:29 AM

## 2011-03-30 NOTE — Plan of Care (Signed)
Problem: Phase II Progression Outcomes Goal: Ambulates Outcome: Progressing Pt able to ambulate 48ft with +2 Total A at time of eval, maintaining 25% WB restriction.  Jessica Richardson, [place] [phone] 03/30/2011 11:37

## 2011-03-30 NOTE — Progress Notes (Signed)
ANTICOAGULATION CONSULT NOTE - Follow Up Consult  Pharmacy Consult for: Coumadin Indication: VTE px s/p L THA  Allergies  Allergen Reactions  . Penicillins Hives   Vital Signs: Temp: 98.1 F (36.7 C) (01/08 0516) BP: 110/46 mmHg (01/08 0516) Pulse Rate: 85  (01/08 0516)  Labs:  Basename 03/30/11 0610  HGB 9.9*  HCT 30.6*  PLT 206  APTT --  LABPROT 14.2  INR 1.08  HEPARINUNFRC --  CREATININE 0.63  CKTOTAL --  CKMB --  TROPONINI --   Estimated Creatinine Clearance: 136.1 ml/min (by C-G formula based on Cr of 0.63).  Assessment: 40yof on coumadin for VTE px s/p L THA. INR remains subtherapeutic after first dose of coumadin as expected.  Goal of Therapy:  INR 2-3   Plan:  1) Repeat coumadin 10mg  x 1 2) Follow up INR in AM 3) Continue lovenox 40mg  q24 until INR >2  Jessica Richardson 03/30/2011,10:49 AM

## 2011-03-31 LAB — GLUCOSE, CAPILLARY
Glucose-Capillary: 150 mg/dL — ABNORMAL HIGH (ref 70–99)
Glucose-Capillary: 135 mg/dL — ABNORMAL HIGH (ref 70–99)

## 2011-03-31 LAB — BASIC METABOLIC PANEL
BUN: 9 mg/dL (ref 6–23)
GFR calc non Af Amer: >90 mL/min (ref >90)
Glucose, Bld: 146 mg/dL — ABNORMAL HIGH (ref 70–99)
Potassium: 3.9 mEq/L (ref 3.5–5.1)

## 2011-03-31 LAB — CBC
HCT: 29.8 % — ABNORMAL LOW (ref 36.0–46.0)
Hemoglobin: 9.6 g/dL — ABNORMAL LOW (ref 12.0–15.0)
MCHC: 32.2 g/dL (ref 30.0–36.0)

## 2011-03-31 LAB — PROTIME-INR: INR: 1.53 — ABNORMAL HIGH (ref 0.00–1.49)

## 2011-03-31 MED ORDER — WARFARIN SODIUM 5 MG PO TABS
5.0000 mg | ORAL_TABLET | Freq: Once | ORAL | Status: AC
  Administered 2011-03-31: 5 mg via ORAL
  Filled 2011-03-31: qty 1

## 2011-03-31 NOTE — Progress Notes (Signed)
ANTICOAGULATION CONSULT NOTE - Follow Up Consult  Pharmacy Consult for Coumadin Indication: VTE px s/p L-THA  Allergies  Allergen Reactions  . Penicillins Hives    Vital Signs: Temp: 100.3 F (37.9 C) (01/09 0518) BP: 126/49 mmHg (01/09 0518) Pulse Rate: 128  (01/09 0518)  Labs:  Basename 03/31/11 0720 03/30/11 0610  HGB 9.6* 9.9*  HCT 29.8* 30.6*  PLT 218 206  APTT -- --  LABPROT 18.7* 14.2  INR 1.53* 1.08  HEPARINUNFRC -- --  CREATININE 0.75 0.63  CKTOTAL -- --  CKMB -- --  TROPONINI -- --   Estimated Creatinine Clearance: 136.1 ml/min (by C-G formula based on Cr of 0.75).   Medications:  Scheduled:    . Chlorhexidine Gluconate Cloth  6 each Topical Q0600  . docusate sodium  100 mg Oral BID  . enoxaparin  40 mg Subcutaneous Q24H  . insulin aspart  0-15 Units Subcutaneous TID WC  . mupirocin ointment   Nasal BID  . senna  1 tablet Oral BID  . Vitamin D (Ergocalciferol)  50,000 Units Oral Q30 days  . warfarin  10 mg Oral ONCE-1800  . warfarin   Does not apply Once    Assessment: 41yo female s/p L-THA, on Coumadin for VTE px.  INR with significant increase this AM toward goal.  No problems noted.   Goal of Therapy:  INR 2-3   Plan:  1.  Coumadin 5mg  today 2.  F/U in AM  Eliott Amparan P 03/31/2011,10:28 AM

## 2011-03-31 NOTE — Progress Notes (Signed)
Physical Therapy Treatment Patient Details Name: Jessica Richardson MRN: [id number] DOB: 1970/09/03 Today's Date: 03/31/2011  PT Assessment/Plan  PT - Assessment/Plan Comments on Treatment Session: Pt admitted for left THA and progressing well.  Pt tolerated treatment much better this am as well as increased distance ambulated.  Still will need STSNF secondary to no assistance available at home for d/c.  To follow. PT Plan: Discharge plan remains appropriate;Frequency remains appropriate PT Frequency: 7X/week Follow Up Recommendations: Skilled nursing facility Equipment Recommended: Defer to next venue PT Goals  Acute Rehab PT Goals PT Goal Formulation: With patient/family Time For Goal Achievement: 7 days Pt will go Sit to Stand: with modified independence PT Goal: Sit to Stand - Progress: Updated due to goal met Pt will go Stand to Sit: with modified independence PT Goal: Stand to Sit - Progress: Updated due to goals met Pt will Ambulate: 51 - 150 feet;with supervision;with least restrictive assistive device PT Goal: Ambulate - Progress: Revised (modified due to lack of progress/goal met) PT Goal: Perform Home Exercise Program - Progress: Progressing toward goal  PT Treatment Precautions/Restrictions  Precautions Precautions: Posterior Hip Precaution Booklet Issued: No (Posterior Hip Precautions given on evaluation.) Precaution Comments: Pt able to recall 2/3 posterior hip precautions and left LE 25% WBing status. Required Braces or Orthoses: No Restrictions Weight Bearing Restrictions: Yes LLE Weight Bearing: Partial weight bearing LLE Partial Weight Bearing Percentage or Pounds: 25% Pain 4/10 in left hip.  Pt repositioned after treatment. Mobility (including Balance) Bed Mobility Bed Mobility: No Sit to Supine: Not Tested (comment) Transfers Transfers: Yes Sit to Stand: 5: Supervision;From bed;With upper extremity assist Sit to Stand Details (indicate cue type and reason):  Verbal cues for safest hand and left LE placement. Stand to Sit: 5: Supervision;With upper extremity assist;To chair/3-in-1 Stand to Sit Details: Verbal cues for safest hand and left LE placement. Ambulation/Gait Ambulation/Gait: Yes Ambulation/Gait Assistance: 4: Min assist Ambulation/Gait Assistance Details (indicate cue type and reason): Assist for balance with cues for sequence inside RW. Ambulation Distance (Feet): 78 Feet Assistive device: Rolling walker Gait Pattern: Step-to pattern;Decreased step length - left;Left flexed knee in stance Stairs: No Wheelchair Mobility Wheelchair Mobility: No  Posture/Postural Control Posture/Postural Control: No significant limitations Balance Balance Assessed: No Exercise  Total Joint Exercises Gluteal Sets: AROM;Both;10 reps;Seated Long Arc Quad: AROM;Left;10 reps;Seated End of Session PT - End of Session Equipment Utilized During Treatment: Gait belt Activity Tolerance: Patient tolerated treatment well Patient left: in chair;with call bell in reach;with family/visitor present Nurse Communication: Mobility status for transfers;Mobility status for ambulation General Behavior During Session: [facility] for tasks performed Cognition: [facility] for tasks performed  Cephus Shelling 03/31/2011, 11:34 AM  03/31/2011 Cephus Shelling, PT, DPT [phone]

## 2011-03-31 NOTE — Progress Notes (Signed)
Orthopedic Tech Progress Note Patient Details:  Jessica Richardson [birth year] [id number]      [name] 03/31/2011, 6:24 PM

## 2011-03-31 NOTE — Progress Notes (Signed)
Physical Therapy Treatment Patient Details Name: Jessica Richardson MRN: [id number] DOB: 01/26/1971 Today's Date: 03/31/2011  PT Assessment/Plan  PT - Assessment/Plan Comments on Treatment Session: Pt admitted for left THA and progressing well.  Pt tolerated treatment.  Still will need STSNF secondary to no assistance available at home for d/c.  To follow. PT Plan: Discharge plan remains appropriate;Frequency remains appropriate PT Frequency: 7X/week Follow Up Recommendations: Skilled nursing facility Equipment Recommended: Defer to next venue PT Goals  Acute Rehab PT Goals PT Goal Formulation: With patient/family Time For Goal Achievement: 7 days PT Goal: Sit to Supine/Side - Progress: Met Pt will go Sit to Stand: with modified independence PT Goal: Sit to Stand - Progress: Progressing toward goal Pt will go Stand to Sit: with modified independence PT Goal: Stand to Sit - Progress: Progressing toward goal Pt will Ambulate: 51 - 150 feet;with supervision;with least restrictive assistive device PT Goal: Ambulate - Progress: Progressing toward goal PT Goal: Perform Home Exercise Program - Progress: Progressing toward goal  PT Treatment Precautions/Restrictions  Precautions Precautions: Posterior Hip Precaution Booklet Issued: No Precaution Comments: Pt able to recall 2/3 posterior hip precautions and left LE 25% WBing status. Required Braces or Orthoses: No Restrictions Weight Bearing Restrictions: Yes LLE Weight Bearing: Partial weight bearing LLE Partial Weight Bearing Percentage or Pounds: 25% Pain 0/10 with treatment. Mobility (including Balance) Bed Mobility Bed Mobility: Yes Sit to Supine: 4: Min assist Sit to Supine - Details (indicate cue type and reason): Assist for left LE with cues for sequence to keep hip in neutral. Transfers Transfers: Yes Sit to Stand: 5: Supervision;With upper extremity assist;From chair/3-in-1 Sit to Stand Details (indicate cue type and  reason): Verbal cues for hand placement. Stand to Sit: 5: Supervision;With upper extremity assist;To bed Stand to Sit Details: Verbal cues for hand placement. Ambulation/Gait Ambulation/Gait: Yes Ambulation/Gait Assistance: 4: Min assist (Min (guard)) Ambulation/Gait Assistance Details (indicate cue type and reason): Guarding for balance with cues for tall posture. Ambulation Distance (Feet): 15 Feet Assistive device: Rolling walker Gait Pattern: Step-to pattern;Decreased step length - left;Left flexed knee in stance Stairs: No Wheelchair Mobility Wheelchair Mobility: No  Posture/Postural Control Posture/Postural Control: No significant limitations Balance Balance Assessed: No Exercise  Total Joint Exercises Ankle Circles/Pumps: AAROM;Left;10 reps;Supine Quad Sets: AROM;Left;10 reps;Supine Gluteal Sets: AROM;Both;10 reps;Supine Heel Slides: AAROM;Left;10 reps;Supine Hip ABduction/ADduction: AAROM;Left;10 reps;Supine Long Arc Quad: AROM;Left;10 reps;Seated End of Session PT - End of Session Equipment Utilized During Treatment: Gait belt Activity Tolerance: Patient tolerated treatment well Patient left: in bed;with call bell in reach Nurse Communication: Mobility status for transfers;Mobility status for ambulation General Behavior During Session: [facility] for tasks performed Cognition: [facility] for tasks performed  Cephus Shelling 03/31/2011, 2:48 PM  03/31/2011 Cephus Shelling, PT, DPT [phone]

## 2011-03-31 NOTE — Progress Notes (Signed)
Subjective: 2 Days Post-Op Procedure(s) (LRB): TOTAL HIP ARTHROPLASTY (Left) Patient reports pain as moderate and severe.    Objective: Vital signs in last 24 hours: Temp:  [98.7 F (37.1 C)-100.3 F (37.9 C)] 100.3 F (37.9 C) (01/09 0518) Pulse Rate:  [86-128] 128  (01/09 0518) Resp:  [18-20] 20  (01/09 0518) BP: (108-126)/(49-59) 126/49 mmHg (01/09 0518) SpO2:  [91 %-99 %] 91 % (01/09 0518)  Intake/Output from previous day: 01/08 0701 - 01/09 0700 In: 1860 [P.O.:960; I.V.:900] Out: 950 [Urine:950] Intake/Output this shift:     Basename 03/31/11 0720 03/30/11 0610  HGB 9.6* 9.9*    Basename 03/31/11 0720 03/30/11 0610  WBC 6.1 5.4  RBC 3.20* 3.26*  HCT 29.8* 30.6*  PLT 218 206    Basename 03/31/11 0720 03/30/11 0610  NA 135 136  K 3.9 4.3  CL 100 103  CO2 24 25  BUN 9 10  CREATININE 0.75 0.63  GLUCOSE 146* 109*  CALCIUM 8.5 8.2*    Basename 03/31/11 0720 03/30/11 0610  LABPT -- --  INR 1.53* 1.08    Sensation intact distally Intact pulses distally Dorsiflexion/Plantar flexion intact Incision: dressing C/D/I  Assessment/Plan: 2 Days Post-Op Procedure(s) (LRB): TOTAL HIP ARTHROPLASTY (Left) Advance diet Up with therapy Plan for discharge tomorrow or friday  Haskel Khan 03/31/2011, 9:42 AM

## 2011-03-31 NOTE — Progress Notes (Signed)
CARE MANAGEMENT NOTE 03/31/2011 Discharge planning. Spoke with patient. Preoperatively setup with [facility]. no changes. Patient has rolling walker and 3in1. States she wants to go to snf for rehab. Social Worker involved. pt may have to go home d/t being medicaid only. Wil follow.

## 2011-04-01 LAB — GLUCOSE, CAPILLARY
Glucose-Capillary: 161 mg/dL — ABNORMAL HIGH (ref 70–99)
Glucose-Capillary: 130 mg/dL — ABNORMAL HIGH (ref 70–99)

## 2011-04-01 LAB — CBC
HCT: 30.4 % — ABNORMAL LOW (ref 36.0–46.0)
MCH: 30 pg (ref 26.0–34.0)
MCHC: 32.2 g/dL (ref 30.0–36.0)
MCV: 93 fL (ref 78.0–100.0)
RDW: 13.3 % (ref 11.5–15.5)

## 2011-04-01 LAB — PROTIME-INR: INR: 1.58 — ABNORMAL HIGH (ref 0.00–1.49)

## 2011-04-01 MED ORDER — WARFARIN SODIUM 7.5 MG PO TABS
7.5000 mg | ORAL_TABLET | Freq: Once | ORAL | Status: AC
  Administered 2011-04-01: 7.5 mg via ORAL
  Filled 2011-04-01: qty 1

## 2011-04-01 NOTE — Progress Notes (Signed)
Subjective: 3 Days Post-Op Procedure(s) (LRB): TOTAL HIP ARTHROPLASTY (Left) Patient reports pain as moderate and severe.   States that she is doing better with PT. Objective: Vital signs in last 24 hours: Temp:  [98.7 F (37.1 C)-99.7 F (37.6 C)] 98.9 F (37.2 C) (01/10 0641) Pulse Rate:  [66-99] 75  (01/10 0641) Resp:  [18-20] 18  (01/10 0641) BP: (114-136)/(51-66) 120/60 mmHg (01/10 0641) SpO2:  [94 %-99 %] 95 % (01/10 0641)  Intake/Output from previous day: 01/09 0701 - 01/10 0700 In: 840 [P.O.:840] Out: 2 [Urine:2] Intake/Output this shift:     Basename 04/01/11 0610 03/31/11 0720 03/30/11 0610  HGB 9.8* 9.6* 9.9*    Basename 04/01/11 0610 03/31/11 0720  WBC 5.5 6.1  RBC 3.27* 3.20*  HCT 30.4* 29.8*  PLT 210 218    Basename 04/01/11 0610 03/31/11 0720 03/30/11 0610  NA -- 135 136  K -- 3.9 4.3  CL -- 100 103  CO2 -- 24 25  BUN -- 9 10  CREATININE -- 0.75 0.63  GLUCOSE 121* 146* --  CALCIUM -- 8.5 8.2*    Basename 04/01/11 0610 03/31/11 0720  LABPT -- --  INR 1.58* 1.53*    Sensation intact distally Intact pulses distally Dorsiflexion/Plantar flexion intact Incision: dressing C/D/I  Assessment/Plan: 3 Days Post-Op Procedure(s) (LRB): TOTAL HIP ARTHROPLASTY (Left) Advance diet Up with therapy Discharge to [facility] Friday  Plan dressing change tomorrow.  Haskel Khan 04/01/2011, 11:36 AM

## 2011-04-01 NOTE — Progress Notes (Signed)
ANTICOAGULATION CONSULT NOTE - Follow Up Consult  Pharmacy Consult for Coumadin Indication: VTE prophylaxis s/p L THA  Assessment: 41 yo Richardson s/p L THA, on Coumadin for VTE px.  INR SUBtherapeutic with minimal change from yesterday. Patient also on Lovenox 40mg /day until INR at goal.  Goal of Therapy:  INR 2-3   Plan:  1.  Coumadin 7.5 mg today 2.  Richardson/U in AM   Allergies  Allergen Reactions  . Penicillins Hives    Vital Signs: Temp: 98.9 Richardson (37.2 C) (01/10 0641) BP: 120/Jessica mmHg (01/10 0641) Pulse Rate: 75  (01/10 0641)  Labs:  Basename 04/01/11 0610 03/31/11 0720 03/30/11 0610  HGB 9.8* 9.6* --  HCT 30.4* 29.8* 30.6*  PLT 210 218 206  APTT -- -- --  LABPROT 19.2* 18.7* 14.2  INR 1.58* 1.53* 1.08  HEPARINUNFRC -- -- --  CREATININE -- 0.75 0.63  CKTOTAL -- -- --  CKMB -- -- --  TROPONINI -- -- --   Estimated Creatinine Clearance: 136.1 ml/min (by C-G formula based on Cr of 0.75).   Medications:  Scheduled:     . Chlorhexidine Gluconate Cloth  6 each Topical Q0600  . docusate sodium  100 mg Oral BID  . enoxaparin  40 mg Subcutaneous Q24H  . insulin aspart  0-15 Units Subcutaneous TID WC  . mupirocin ointment   Nasal BID  . senna  1 tablet Oral BID  . Vitamin D (Ergocalciferol)  50,000 Units Oral Q30 days  . warfarin  5 mg Oral ONCE-1800  . warfarin   Does not apply Once    [facility], Maryanna Shape 04/01/2011,12:38 PM

## 2011-04-01 NOTE — Progress Notes (Signed)
Late Entry:  Met with patient and her mother to discuss possible short term SNF. Patient stated that she did not have anyone to stay with her at home; mother works and is unable to assist.  Patient wanted placement in [place]. She does not have insurance to pay for SNF; her Medicaid is "family medicaid" only - and she would require a letter of guarantee. Due to her age and no insurance- referral faxed to [facility] and surrounding counties in attempt to locate a bed.  It is doubtful that a facility will offer at this time due to need for letter of guarantee. Patient and mother are aware.  Discussed with RNCM- Vance Peper.  Yellow assessment and Fl2- placed in shadow chart for MD's signature. Lovette Cliche T, BSW,04/01/2011 6:59 PM

## 2011-04-01 NOTE — Progress Notes (Signed)
Physical Therapy Treatment Patient Details Name: Jessica Richardson MRN: [id number] DOB: 02-Feb-1971 Today's Date: 04/01/2011  PT Assessment/Plan  PT - Assessment/Plan Comments on Treatment Session: Pt admitted for left THA and progressing well.  Pt tolerated treatment.  Still will need STSNF secondary to no assistance available at home for d/c.  Limited by fatigue and pain. PT Plan: Discharge plan remains appropriate;Frequency remains appropriate PT Frequency: 7X/week Follow Up Recommendations: Skilled nursing facility Equipment Recommended: Defer to next venue PT Goals  Acute Rehab PT Goals PT Goal Formulation: With patient/family Time For Goal Achievement: 7 days PT Goal: Sit to Stand - Progress: Progressing toward goal PT Goal: Stand to Sit - Progress: Progressing toward goal PT Goal: Ambulate - Progress: Progressing toward goal PT Goal: Perform Home Exercise Program - Progress: Progressing toward goal  PT Treatment Precautions/Restrictions  Precautions Precautions: Posterior Hip Precaution Booklet Issued: No Precaution Comments: Pt able to recall 3/3 posterior hip precautions and left LE 25% WBing status. Required Braces or Orthoses: No Restrictions Weight Bearing Restrictions: Yes LLE Weight Bearing: Partial weight bearing LLE Partial Weight Bearing Percentage or Pounds: 25% Pain 7/10 in left hip with mobility.  Pt repositioned. Mobility (including Balance) Bed Mobility Bed Mobility: No Sit to Supine: Not Tested (comment) Transfers Transfers: Yes Sit to Stand: 5: Supervision;With upper extremity assist;From chair/3-in-1 Sit to Stand Details (indicate cue type and reason): Verbal cues in order to maintain hip precautions. Stand to Sit: 5: Supervision;With upper extremity assist;To chair/3-in-1 Stand to Sit Details: Verbal cues in order to maintain hip precautions. Ambulation/Gait Ambulation/Gait: Yes Ambulation/Gait Assistance: 4: Min assist (Up to min  assist.) Ambulation/Gait Assistance Details (indicate cue type and reason): Assist for balance as pt fatigued with cues to maintain PWB 25% left LE throughout. Ambulation Distance (Feet): 85 Feet Assistive device: Rolling walker Gait Pattern: Step-to pattern;Decreased step length - left;Left flexed knee in stance Stairs: No Wheelchair Mobility Wheelchair Mobility: No  Posture/Postural Control Posture/Postural Control: No significant limitations Balance Balance Assessed: No Exercise  Total Joint Exercises Gluteal Sets: AROM;Both;Other reps (comment);Seated (20) Heel Slides: AROM;20 reps;Seated;Left Long Arc Quad: AROM;Left;20 reps;Seated General Exercises - Lower Extremity Toe Raises: AROM;Left;20 reps;Seated Heel Raises: AROM;Left;Other reps (comment);Seated (20) End of Session PT - End of Session Equipment Utilized During Treatment: Gait belt Activity Tolerance: Patient tolerated treatment well Patient left: in chair;with call bell in reach Nurse Communication: Mobility status for transfers;Mobility status for ambulation General Behavior During Session: [facility] for tasks performed Cognition: [facility] for tasks performed  Cephus Shelling 04/01/2011, 11:15 AM  04/01/2011 Cephus Shelling, PT, DPT [phone]

## 2011-04-02 LAB — PROTIME-INR: INR: 1.63 — ABNORMAL HIGH (ref 0.00–1.49)

## 2011-04-02 LAB — GLUCOSE, RANDOM: Glucose, Bld: 131 mg/dL — ABNORMAL HIGH (ref 70–99)

## 2011-04-02 NOTE — Progress Notes (Signed)
ANTICOAGULATION CONSULT NOTE - Follow Up Consult  Pharmacy Consult for Coumadin Indication: VTE prophylaxis s/p L THA  Assessment: 41 yo F s/p L THA, on Coumadin for VTE px.  INR  increased to 1.63 from yesterday. Patient also on Lovenox 40mg /day until INR at goal.  Goal of Therapy:  INR 2-3   Plan:  1.  No dose ordered today as patient is discharging home today, pharmacy will follow-up if discharge plans change.     Allergies  Allergen Reactions  . Penicillins Hives    Vital Signs: Temp: 98.4 F (36.9 C) (01/11 0526) BP: 131/76 mmHg (01/11 0526) Pulse Rate: 103  (01/11 0526)  Labs:  Basename 04/02/11 0605 04/01/11 0610 03/31/11 0720  HGB -- 9.8* 9.6*  HCT -- 30.4* 29.8*  PLT -- 210 218  APTT -- -- --  LABPROT 19.6* 19.2* 18.7*  INR 1.63* 1.58* 1.53*  HEPARINUNFRC -- -- --  CREATININE -- -- 0.75  CKTOTAL -- -- --  CKMB -- -- --  TROPONINI -- -- --   Estimated Creatinine Clearance: 136.1 ml/min (by C-G formula based on Cr of 0.75).   Medications:  Scheduled:     . Chlorhexidine Gluconate Cloth  6 each Topical Q0600  . docusate sodium  100 mg Oral BID  . enoxaparin  40 mg Subcutaneous Q24H  . insulin aspart  0-15 Units Subcutaneous TID WC  . mupirocin ointment   Nasal BID  . senna  1 tablet Oral BID  . Vitamin D (Ergocalciferol)  50,000 Units Oral Q30 days  . warfarin  7.5 mg Oral ONCE-1800  . warfarin   Does not apply Once    RuedingerMaryagnes Amos 04/02/2011,10:39 AM

## 2011-04-02 NOTE — Progress Notes (Signed)
Physical Therapy Treatment Patient Details Name: Jessica Richardson MRN: [id number] DOB: 1970-06-06 Today's Date: 04/02/2011  PT Assessment/Plan  PT - Assessment/Plan Comments on Treatment Session: Pt admitted for left THA and progressing well.  Limited by fatigue and pain.  Discussed with pt HHPT option if unable to go to SNF.  Pt agreeable to this plan as a last resort.  Pt progressing very well. PT Plan: Discharge plan remains appropriate;Frequency remains appropriate PT Frequency: 7X/week Follow Up Recommendations: Skilled nursing facility Equipment Recommended: Defer to next venue PT Goals  Acute Rehab PT Goals PT Goal Formulation: With patient/family Time For Goal Achievement: 7 days PT Goal: Supine/Side to Sit - Progress: Met PT Goal: Sit to Stand - Progress: Progressing toward goal PT Goal: Stand to Sit - Progress: Progressing toward goal PT Goal: Ambulate - Progress: Progressing toward goal PT Goal: Perform Home Exercise Program - Progress: Progressing toward goal  PT Treatment Precautions/Restrictions  Precautions Precautions: Posterior Hip Precaution Booklet Issued: No Precaution Comments: Pt able to recall 3/3 posterior hip precautions and left LE 25% WBing status. Required Braces or Orthoses: No Restrictions Weight Bearing Restrictions: Yes LLE Weight Bearing: Partial weight bearing LLE Partial Weight Bearing Percentage or Pounds: 25% Pain 0/10 with treatment. Mobility (including Balance) Bed Mobility Bed Mobility: Yes Supine to Sit: 5: Supervision;HOB elevated (Comment degrees) (HOB 45 degrees.) Supine to Sit Details (indicate cue type and reason): Cues for sequence to maintain posterior hip precautions. Sitting - Scoot to Edge of Bed: 6: Modified independent (Device/Increase time) Sit to Supine: Not Tested (comment) Transfers Transfers: Yes Sit to Stand: 5: Supervision;With upper extremity assist;From bed Sit to Stand Details (indicate cue type and reason):  Verbal cues to maintain posterior hip precautions. Stand to Sit: 5: Supervision;With upper extremity assist;To chair/3-in-1 Stand to Sit Details: Verbal cues to maintain posterior hip precautions. Ambulation/Gait Ambulation/Gait: Yes Ambulation/Gait Assistance: 4: Min assist (Min (guard)) Ambulation/Gait Assistance Details (indicate cue type and reason): Guarding for balance with cues to continue to maintain PWBing (25%) left LE. Ambulation Distance (Feet): 60 Feet Assistive device: Rolling walker Gait Pattern: Step-to pattern;Decreased step length - left Stairs: No (Educated pt on backward sequence with RW.) Wheelchair Mobility Wheelchair Mobility: No  Posture/Postural Control Posture/Postural Control: No significant limitations Balance Balance Assessed: No Exercise  Total Joint Exercises Ankle Circles/Pumps: AROM;Left;10 reps;Seated Quad Sets: AROM;Left;10 reps;Seated Heel Slides: AROM;Left;10 reps;Seated Hip ABduction/ADduction: AROM;Left;10 reps;Seated Long Arc Quad: AROM;Left;10 reps;Seated General Exercises - Lower Extremity Toe Raises: AROM;Left;10 reps;Seated Heel Raises: AROM;Left;10 reps;Seated End of Session PT - End of Session Equipment Utilized During Treatment: Gait belt Activity Tolerance: Patient tolerated treatment well Patient left: in chair;with call bell in reach;with family/visitor present Nurse Communication: Mobility status for transfers;Mobility status for ambulation General Behavior During Session: [facility] for tasks performed Cognition: [facility] for tasks performed  Cephus Shelling 04/02/2011, 1:06 PM  04/02/2011 Cephus Shelling, PT, DPT [phone]

## 2011-04-02 NOTE — Progress Notes (Signed)
Occupational Therapy Treatment Patient Details Name: Jessica Richardson MRN: [id number] DOB: 1970/09/02 Today's Date: 04/02/2011  OT Assessment/Plan OT Assessment/Plan Comments on Treatment Session: Pt states she feels she is making progress. OT Plan: Discharge plan remains appropriate OT Frequency: Min 1X/week Follow Up Recommendations: Skilled nursing facility Equipment Recommended: Defer to next venue OT Goals Acute Rehab OT Goals OT Goal Formulation: With patient ADL Goals Pt Will Perform Lower Body Bathing: with supervision;Sit to stand from bed;with adaptive equipment;with cueing (comment type and amount) ADL Goal: Lower Body Bathing - Progress: Progressing toward goals ADL Goal: Lower Body Dressing - Progress: Progressing toward goals ADL Goal: Toilet Transfer - Progress: Met Pt Will Perform Toileting - Clothing Manipulation: Independently ADL Goal: Toileting - Clothing Manipulation - Progress: Met Pt Will Perform Toileting - Hygiene: Independently ADL Goal: Toileting - Hygiene - Progress: Met Miscellaneous OT Goals Miscellaneous OT Goal #1: State 3/3 THP independently  OT Goal: Miscellaneous Goal #1 - Progress: Met  OT Treatment Precautions/Restrictions  Precautions Precautions: Posterior Hip Precaution Booklet Issued: No Precaution Comments: Pt able to recall 3/3 posterior hip precautions and left LE 25% WBing status. Required Braces or Orthoses: No Restrictions Weight Bearing Restrictions: Yes LLE Weight Bearing: Partial weight bearing LLE Partial Weight Bearing Percentage or Pounds: 25%   ADL ADL Eating/Feeding: Independent Grooming: Performed;Wash/dry hands;Wash/dry face;Supervision/safety Where Assessed - Grooming: Standing at sink Toilet Transfer: Supervision/safety;Performed Toilet Transfer Method: Stand pivot;Ambulating Acupuncturist: Extra wide bedside commode;Drop arm bedside commode Toileting - Clothing Manipulation: Independent Where  Assessed - Toileting Clothing Manipulation: Standing Toileting - Hygiene: Independent Where Assessed - Toileting Hygiene: Standing Tub/Shower Transfer: Simulated;Moderate assistance Tub/Shower Transfer Method: Science writer: Transfer tub bench Ambulation Related to ADLs: Supervision with all mobility. No cues for THP during this session ADL Comments: Pt's mother is purchasing a hip kit prior to D/C Mobility  Bed Mobility Bed Mobility: No Supine to Sit: 5: Supervision;HOB elevated (Comment degrees) (HOB 45 degrees.) Supine to Sit Details (indicate cue type and reason): Cues for sequence to maintain posterior hip precautions. Sitting - Scoot to Edge of Bed: 6: Modified independent (Device/Increase time) Sit to Supine: Not Tested (comment) Transfers Transfers: Yes Sit to Stand: 5: Supervision Sit to Stand Details (indicate cue type and reason): Verbal cues to maintain posterior hip precautions. Stand to Sit: 5: Supervision;With upper extremity assist;To chair/3-in-1 Stand to Sit Details: Verbal cues to maintain posterior hip precautions. End of Session General Behavior During Session: [facility] for tasks performed Cognition: [facility] for tasks performed  Aquinnah Devin,HILLARY  04/02/2011, 1:32 PM [facility], OTR/L  [phone] 04/02/2011

## 2011-04-02 NOTE — Progress Notes (Signed)
Pt given Lovenox Ed. Sheet . Pt declines practice with syringe. Verbal instruction given and pt verbalizes understanding.

## 2011-04-02 NOTE — Discharge Summary (Signed)
Physician Discharge Summary  Patient ID: Jessica Richardson MRN: [id number] DOB/AGE: 41/22/1972 40 y.o.  Admit date: 03/29/2011 Discharge date: 04/02/2011  Admission Diagnoses:  Hip arthritis History of slipped capital femoral epiphysis, morbid obesity, diet controlled diabetes  Discharge Diagnoses:  Principal Problem:  *Hip arthritis, left, post-SCFE  same  Past Medical History  Diagnosis Date  . Arthritis   . Diabetes mellitus     diet conttrolled  only,no meds ever  . Hip arthritis, left, post-SCFE 03/29/2011    Surgeries: Procedure(s): TOTAL HIP ARTHROPLASTY on 03/29/2011   Consultants (if any):    Discharged Condition: Improved  Hospital Course: Jessica Richardson is an 41 y.o. female who was admitted 03/29/2011 with a diagnosis of Hip arthritis and went to the operating room on 03/29/2011 and underwent the above named procedures.  I had to remove a number of Knowles pins, a total of 5, in order to do the hip replacement, and therefore I have kept her 25% weightbearing, particularly in light of her morbid obesity.  She was given perioperative antibiotics:  Anti-infectives     Start     Dose/Rate Route Frequency Ordered Stop   03/30/11 0200   vancomycin (VANCOCIN) IVPB 1000 mg/200 mL premix        1,000 mg 200 mL/hr over 60 Minutes Intravenous  Once 03/29/11 1908 03/30/11 0354   03/29/11 1830   vancomycin (VANCOCIN) IVPB 1000 mg/200 mL premix  Status:  Discontinued        1,000 mg 200 mL/hr over 60 Minutes Intravenous Every 12 hours 03/29/11 1817 03/29/11 1907   03/29/11 1145   ceFAZolin (ANCEF) IVPB 1 g/50 mL premix  Status:  Discontinued        1 g 100 mL/hr over 30 Minutes Intravenous 60 min pre-op 03/29/11 1131 03/29/11 1132   03/29/11 1145   ceFAZolin (ANCEF) IVPB 2 g/50 mL premix  Status:  Discontinued     Comments: Dr. Dion Saucier notified of penicillin allergy. Stated to do a test dose with ancef.      2 g 100 mL/hr over 30 Minutes Intravenous 60 min pre-op 03/29/11 1132  03/29/11 1941   03/29/11 1135   ceFAZolin (ANCEF) 2-3 GM-% IVPB SOLR     Comments: MOORE, TERRI: cabinet override         03/29/11 1135 03/29/11 2344        .  She was given sequential compression devices, early ambulation, and chemoprophylaxis for DVT prophylaxis.  She benefited maximally from their hospital stay and there were no complications.    Recent vital signs:  Filed Vitals:   04/02/11 0526  BP: 131/76  Pulse: 103  Temp: 98.4 F (36.9 C)  Resp: 20    Recent laboratory studies:  Lab Results  Component Value Date   HGB 9.8* 04/01/2011   HGB 9.6* 03/31/2011   HGB 9.9* 03/30/2011   Lab Results  Component Value Date   WBC 5.5 04/01/2011   PLT 210 04/01/2011   Lab Results  Component Value Date   INR 1.63* 04/02/2011   Lab Results  Component Value Date   NA 135 03/31/2011   K 3.9 03/31/2011   CL 100 03/31/2011   CO2 24 03/31/2011   BUN 9 03/31/2011   CREATININE 0.75 03/31/2011   GLUCOSE 131* 04/02/2011    Discharge Medications:   Current Discharge Medication List    START taking these medications   Details  enoxaparin (LOVENOX) 40 MG/0.4ML SOLN Inject 0.4 mLs (40 mg total) into the  skin daily. Qty: 5 Syringe, Refills: 0    methocarbamol (ROBAXIN) 500 MG tablet Take 1 tablet (500 mg total) by mouth 4 (four) times daily. Qty: 75 tablet, Refills: 1    oxyCODONE-acetaminophen (PERCOCET) 10-325 MG per tablet Take 1-2 tablets by mouth every 6 (six) hours as needed for pain. MAXIMUM TOTAL ACETAMINOPHEN DOSE IS 4000 MG PER DAY Qty: 75 tablet, Refills: 0    warfarin (COUMADIN) 5 MG tablet Take 1 tablet (5 mg total) by mouth daily. Qty: 30 tablet, Refills: 1      CONTINUE these medications which have NOT CHANGED   Details  ergocalciferol (VITAMIN D2) 50000 UNITS capsule Take 50,000 Units by mouth every 30 (thirty) days.       STOP taking these medications     meloxicam (MOBIC) 15 MG tablet      traMADol (ULTRAM) 50 MG tablet         Diagnostic Studies:  Chest 2  View  03/24/2011  *RADIOLOGY REPORT*  Clinical Data: Preop respiratory examination for knee surgery. History of diabetes.  CHEST - 2 VIEW  Comparison: None.  Findings: The heart size and mediastinal contours are normal. The lungs are clear. There is no pleural effusion or pneumothorax. No acute osseous findings are identified.  IMPRESSION: No active cardiopulmonary process.  Original Report Authenticated By: Gerrianne Scale, M.D.   Dg Pelvis Portable  03/29/2011  *RADIOLOGY REPORT*  Clinical Data: Left hip replacement.  PORTABLE PELVIS  Comparison: Lateral view 03/29/2011  Findings: Single view of the pelvis demonstrates a left hip arthroplasty.  There is no evidence for a hardware complication. Possible cortical lucency involving the proximal left femur could be postoperative in etiology.  There are screws extending through the right femoral head and neck.  IMPRESSION: Left hip replacement without a gross hardware complication.  Lucency along the proximal left femur lateral cortex as described. Recommend attention this area on followup imaging.  Original Report Authenticated By: Richarda Overlie, M.D.   Dg Hip Portable 1 View Left  03/29/2011  *RADIOLOGY REPORT*  Clinical Data: Left hip replacement.  PORTABLE LEFT HIP - 1 VIEW  Comparison: Pelvis 03/28/2010  Findings: Lateral views are severely limited due to the patient's body habitus.  The left hip replacement is poorly visualized on these views.  These images are nondiagnostic.  No gross abnormality involving the femoral prosthetic stem.  IMPRESSION: Severely limited examination.  Cannot evaluate the left prosthetic hip articulation.  Original Report Authenticated By: Richarda Overlie, M.D.    Disposition: Final discharge disposition not confirmed  Discharge Orders    Future Orders Please Complete By Expires   Diet general      Call MD / Call 911      Comments:   If you experience chest pain or shortness of breath, CALL 911 and be transported to the hospital  emergency room.  If you develope a fever above 101 F, pus (white drainage) or increased drainage or redness at the wound, or calf pain, call your surgeon's office.   Constipation Prevention      Comments:   Drink plenty of fluids.  Prune juice may be helpful.  You may use a stool softener, such as Colace (over the counter) 100 mg twice a day.  Use MiraLax (over the counter) for constipation as needed.   Increase activity slowly as tolerated      Weight Bearing as taught in Physical Therapy      Comments:   Use a walker or crutches as  instructed.   Do not sit on low chairs, stoools or toilet seats, as it may be difficult to get up from low surfaces      Discharge wound care:      Comments:   If you have a hip bandage, keep it clean and dry.  Change your bandage as instructed by your health care providers.  If your bandage has been discontinued, keep your incision clean and dry.  Pat dry after bathing.  DO NOT put lotion or powder on your incision.   Discharge instructions      Comments:   Keep wounds clean and dry, observe total hip precautions as instructed by physical therapy.   TED hose      Comments:   Use stockings (TED hose) for 2 weeks on both leg(s).  You may remove them at night for sleeping.   Change dressing      Comments:   You may change your dressing in 3 days, then change the dressing daily with sterile 4 x 4 inch gauze dressing and paper tape.  You may clean the incision with alcohol prior to redressing   Follow the hip precautions as taught in Physical Therapy         Follow-up Information    Follow up with Vernesha Talbot P in 2 weeks.   Contact information:   [facility] [address], [address] [place] [address] [phone]           Signed: Eulas Post 04/02/2011, 7:22 AM

## 2012-04-11 ENCOUNTER — Ambulatory Visit: Payer: Self-pay | Admitting: Internal Medicine

## 2012-05-13 ENCOUNTER — Inpatient Hospital Stay (HOSPITAL_COMMUNITY)
Admission: EM | Admit: 2012-05-13 | Discharge: 2012-05-20 | DRG: 493 | Disposition: A | Discharge status: Home Health | Payer: Medicaid Other | Attending: Orthopedic Surgery | Admitting: Orthopedic Surgery

## 2012-05-13 ENCOUNTER — Inpatient Hospital Stay (HOSPITAL_COMMUNITY): Payer: Medicaid Other

## 2012-05-13 ENCOUNTER — Encounter (HOSPITAL_COMMUNITY): Payer: Self-pay | Admitting: *Deleted

## 2012-05-13 ENCOUNTER — Emergency Department (HOSPITAL_COMMUNITY): Payer: Medicaid Other

## 2012-05-13 ENCOUNTER — Other Ambulatory Visit: Payer: Self-pay | Admitting: Physician Assistant

## 2012-05-13 DIAGNOSIS — E861 Hypovolemia: Secondary | ICD-10-CM | POA: Diagnosis present

## 2012-05-13 DIAGNOSIS — Z8739 Personal history of other diseases of the musculoskeletal system and connective tissue: Secondary | ICD-10-CM

## 2012-05-13 DIAGNOSIS — F3289 Other specified depressive episodes: Secondary | ICD-10-CM | POA: Diagnosis present

## 2012-05-13 DIAGNOSIS — F329 Major depressive disorder, single episode, unspecified: Secondary | ICD-10-CM | POA: Diagnosis present

## 2012-05-13 DIAGNOSIS — Z96649 Presence of unspecified artificial hip joint: Secondary | ICD-10-CM

## 2012-05-13 DIAGNOSIS — E119 Type 2 diabetes mellitus without complications: Secondary | ICD-10-CM | POA: Diagnosis present

## 2012-05-13 DIAGNOSIS — Y92009 Unspecified place in unspecified non-institutional (private) residence as the place of occurrence of the external cause: Secondary | ICD-10-CM

## 2012-05-13 DIAGNOSIS — Z87891 Personal history of nicotine dependence: Secondary | ICD-10-CM

## 2012-05-13 DIAGNOSIS — S82201A Unspecified fracture of shaft of right tibia, initial encounter for closed fracture: Secondary | ICD-10-CM

## 2012-05-13 DIAGNOSIS — Z6841 Body Mass Index (BMI) 40.0 and over, adult: Secondary | ICD-10-CM

## 2012-05-13 DIAGNOSIS — S82409A Unspecified fracture of shaft of unspecified fibula, initial encounter for closed fracture: Principal | ICD-10-CM | POA: Diagnosis present

## 2012-05-13 DIAGNOSIS — M1610 Unilateral primary osteoarthritis, unspecified hip: Secondary | ICD-10-CM | POA: Diagnosis present

## 2012-05-13 DIAGNOSIS — S82209A Unspecified fracture of shaft of unspecified tibia, initial encounter for closed fracture: Secondary | ICD-10-CM

## 2012-05-13 DIAGNOSIS — Z79899 Other long term (current) drug therapy: Secondary | ICD-10-CM

## 2012-05-13 DIAGNOSIS — W108XXA Fall (on) (from) other stairs and steps, initial encounter: Secondary | ICD-10-CM | POA: Diagnosis present

## 2012-05-13 DIAGNOSIS — S82101A Unspecified fracture of upper end of right tibia, initial encounter for closed fracture: Secondary | ICD-10-CM

## 2012-05-13 HISTORY — DX: Major depressive disorder, single episode, unspecified: F32.9

## 2012-05-13 HISTORY — DX: Depression, unspecified: F32.A

## 2012-05-13 LAB — URINALYSIS, ROUTINE W REFLEX MICROSCOPIC
Bilirubin Urine: NEGATIVE
Ketones, ur: NEGATIVE mg/dL
Nitrite: NEGATIVE
Specific Gravity, Urine: 1.03 (ref 1.005–1.030)
Urobilinogen, UA: 1 mg/dL (ref 0.0–1.0)

## 2012-05-13 LAB — CBC WITH DIFFERENTIAL/PLATELET
HCT: 30.7 % — ABNORMAL LOW (ref 36.0–46.0)
Hemoglobin: 10.2 g/dL — ABNORMAL LOW (ref 12.0–15.0)
Lymphocytes Relative: 18 % (ref 12–46)
Monocytes Absolute: 0.4 10*3/uL (ref 0.1–1.0)
Monocytes Relative: 7 % (ref 3–12)
Neutro Abs: 3.6 10*3/uL (ref 1.7–7.7)
RBC: 3.42 MIL/uL — ABNORMAL LOW (ref 3.87–5.11)
WBC: 5 10*3/uL (ref 4.0–10.5)

## 2012-05-13 LAB — PROTIME-INR
INR: 1.13 (ref 0.00–1.49)
Prothrombin Time: 14.3 seconds (ref 11.6–15.2)

## 2012-05-13 LAB — COMPREHENSIVE METABOLIC PANEL
AST: 15 U/L (ref 0–37)
Alkaline Phosphatase: 126 U/L — ABNORMAL HIGH (ref 39–117)
BUN: 13 mg/dL (ref 6–23)
CO2: 27 mEq/L (ref 19–32)
Chloride: 104 mEq/L (ref 96–112)
Creatinine, Ser: 0.78 mg/dL (ref 0.50–1.10)
GFR calc non Af Amer: >90 mL/min (ref >90)
Total Bilirubin: 1 mg/dL (ref 0.3–1.2)

## 2012-05-13 LAB — APTT: aPTT: 32 seconds (ref 24–37)

## 2012-05-13 LAB — GLUCOSE, CAPILLARY: Glucose-Capillary: 148 mg/dL — ABNORMAL HIGH (ref 70–99)

## 2012-05-13 LAB — SURGICAL PCR SCREEN: MRSA, PCR: NEGATIVE

## 2012-05-13 MED ORDER — METHOCARBAMOL 100 MG/ML IJ SOLN
500.0000 mg | Freq: Four times a day (QID) | INTRAVENOUS | Status: DC | PRN
  Filled 2012-05-13: qty 5

## 2012-05-13 MED ORDER — ONDANSETRON HCL 4 MG/2ML IJ SOLN
4.0000 mg | Freq: Once | INTRAMUSCULAR | Status: AC
  Administered 2012-05-13: 4 mg via INTRAVENOUS
  Filled 2012-05-13: qty 2

## 2012-05-13 MED ORDER — BUPROPION HCL ER (SR) 150 MG PO TB12
150.0000 mg | ORAL_TABLET | Freq: Two times a day (BID) | ORAL | Status: DC
  Administered 2012-05-13 – 2012-05-15 (×4): 150 mg via ORAL
  Filled 2012-05-13 (×5): qty 1

## 2012-05-13 MED ORDER — METHOCARBAMOL 500 MG PO TABS
500.0000 mg | ORAL_TABLET | Freq: Four times a day (QID) | ORAL | Status: DC | PRN
  Administered 2012-05-13 – 2012-05-15 (×5): 500 mg via ORAL
  Filled 2012-05-13 (×5): qty 1

## 2012-05-13 MED ORDER — SODIUM CHLORIDE 0.9 % IV SOLN
Freq: Once | INTRAVENOUS | Status: AC
  Administered 2012-05-13: 04:00:00 via INTRAVENOUS

## 2012-05-13 MED ORDER — HYDROMORPHONE HCL PF 1 MG/ML IJ SOLN
0.5000 mg | INTRAMUSCULAR | Status: DC | PRN
  Administered 2012-05-13 – 2012-05-15 (×8): 1 mg via INTRAVENOUS
  Filled 2012-05-13 (×8): qty 1

## 2012-05-13 MED ORDER — VANCOMYCIN HCL 10 G IV SOLR
1500.0000 mg | INTRAVENOUS | Status: DC
  Filled 2012-05-13: qty 1500

## 2012-05-13 MED ORDER — HYDROMORPHONE HCL PF 1 MG/ML IJ SOLN
1.0000 mg | Freq: Once | INTRAMUSCULAR | Status: AC
  Administered 2012-05-13: 1 mg via INTRAVENOUS
  Filled 2012-05-13: qty 1

## 2012-05-13 MED ORDER — SODIUM CHLORIDE 0.9 % IJ SOLN
10.0000 mL | INTRAMUSCULAR | Status: DC | PRN
  Administered 2012-05-13: 20 mL
  Administered 2012-05-14 (×2): 10 mL
  Administered 2012-05-15: 20 mL

## 2012-05-13 MED ORDER — SODIUM CHLORIDE 0.9 % IJ SOLN
10.0000 mL | Freq: Two times a day (BID) | INTRAMUSCULAR | Status: DC
  Administered 2012-05-14: 40 mL

## 2012-05-13 MED ORDER — POTASSIUM CHLORIDE IN NACL 20-0.9 MEQ/L-% IV SOLN
INTRAVENOUS | Status: DC
  Administered 2012-05-14: 23:00:00 via INTRAVENOUS
  Filled 2012-05-13 (×3): qty 1000

## 2012-05-13 MED ORDER — OXYCODONE HCL 5 MG PO TABS
5.0000 mg | ORAL_TABLET | ORAL | Status: DC | PRN
  Administered 2012-05-13 – 2012-05-15 (×5): 10 mg via ORAL
  Filled 2012-05-13 (×5): qty 2

## 2012-05-13 MED ORDER — BISACODYL 5 MG PO TBEC: 5.0000 mg | DELAYED_RELEASE_TABLET | Freq: Every day | ORAL | Status: DC | PRN

## 2012-05-13 MED ORDER — ASPIRIN EC 325 MG PO TBEC
325.0000 mg | DELAYED_RELEASE_TABLET | Freq: Every day | ORAL | Status: DC
  Administered 2012-05-14: 325 mg via ORAL
  Filled 2012-05-13 (×3): qty 1

## 2012-05-13 MED ORDER — DOCUSATE SODIUM 100 MG PO CAPS
100.0000 mg | ORAL_CAPSULE | Freq: Two times a day (BID) | ORAL | Status: DC
  Administered 2012-05-13 – 2012-05-15 (×4): 100 mg via ORAL
  Filled 2012-05-13 (×5): qty 1

## 2012-05-13 NOTE — ED Notes (Signed)
Paged ortho tech with new splint orders.  Ortho tech will be here shortly.

## 2012-05-13 NOTE — Procedures (Signed)
Central Venous Catheter Insertion Procedure Note AMAURI MEDELLIN [id number] July 12, 1970  Procedure: Insertion of Central Venous Catheter Indications: Drug and/or fluid administration  Procedure Details Consent: Risks of procedure as well as the alternatives and risks of each were explained to the (patient/caregiver).  Consent for procedure obtained. Time Out: Verified patient identification, verified procedure, site/side was marked, verified correct patient position, special equipment/implants available, medications/allergies/relevent history reviewed, required imaging and test results available.  Performed Real time [place] used to ID and cannulate the vessel  Maximum sterile technique was used including antiseptics, cap, gloves, gown, hand hygiene, mask and sheet. Skin prep: Chlorhexidine; local anesthetic administered A antimicrobial bonded/coated triple lumen catheter was placed in the right internal jugular vein using the Seldinger technique.  Evaluation Blood flow good Complications: No apparent complications Patient did tolerate procedure well. Chest X-ray ordered to verify placement.  CXR: pending.  Anders Simmonds ACNP-BC [facility] Pulmonary/Critical Care Pager # [phone] OR # [phone] if no answer  BABCOCK,PETE 05/13/2012, 1:47 PM  I was present for this procedure. Luisa Hart WrightMD

## 2012-05-13 NOTE — Progress Notes (Signed)
Patient ID: Jessica Richardson, female   DOB: [birth year], 42 y.o.   MRN: [id number] I stopped by to check on [name]'s right leg since I first saw her this am prior to finding out that she is a patient of Dr. Dion Saucier.  Early this morning, her right leg compartments were soft, she could move her toes easily, her foot had palpable pulses and normal sensation.  I checked on her again around 10 am and now at 6:20 pm.  She appears comfortable as well now and denies numbness in her foot.  He right leg is still soft and well-perfused.  She has an ice pack, the leg is slightly elevated, and she has a posterior splint in place.  I will continue to check on her as well to make sure that compartment releases are not warranted.  I did review her CT scan and she will likely need plating of her proximal tibia fracture.  It is prudent to let the swelling subside as much as possible.  If Dr. Dion Saucier is not available next week to assume care, I do not mind taking her on my service and eventually to the OR.

## 2012-05-13 NOTE — ED Notes (Signed)
CMS intact 

## 2012-05-13 NOTE — ED Notes (Signed)
Pt returned from xray

## 2012-05-13 NOTE — ED Notes (Addendum)
R Central line placed by [facility] with CCM.  Awaiting X-ray to confirm placement.

## 2012-05-13 NOTE — ED Provider Notes (Signed)
History     CSN: [id number]  Arrival date & time 05/13/12  0247   First MD Initiated Contact with Patient 05/13/12 [phone]      Chief Complaint  Patient presents with  . Leg Injury    (Consider location/radiation/quality/duration/timing/severity/associated sxs/prior treatment) HPI History provided by patient. By EMS after fall from stairs while in her attic tonight. She has pain with swelling to her proximal right tibial area. She denies any other pain injury or trauma. No LOC. No headache or neck pain. No weakness or numbness. It hurts to try to move her right leg. Pain is sharp in quality and moderate to severe. Was given IV fentanyl in route with minimal improvement. She has previous history of left hip arthroplasty - Dr. Dion Saucier.    Past Medical History  Diagnosis Date  . Arthritis   . Diabetes mellitus     diet conttrolled  only,no meds ever  . Hip arthritis, left, post-SCFE 03/29/2011    Past Surgical History  Procedure Laterality Date  . Abdominal hysterectomy    . Appendectomy    . Tonsillectomy    . Cesarean section    . Joint replacement      pins both hips  . Total hip arthroplasty  03/29/2011    Procedure: TOTAL HIP ARTHROPLASTY;  Surgeon: Eulas Post;  Location: [facility] OR;  Service: Orthopedics;  Laterality: Left;  with removal of retained hardware (knowles pins)    History reviewed. No pertinent family history.  History  Substance Use Topics  . Smoking status: Former Smoker    Quit date: 03/24/2011  . Smokeless tobacco: Not on file  . Alcohol Use: Yes     Comment: rarely    OB History   Grav Para Term Preterm Abortions TAB SAB Ect Mult Living                  Review of Systems  Constitutional: Negative for fever and chills.  HENT: Negative for neck pain and neck stiffness.   Eyes: Negative for pain.  Respiratory: Negative for shortness of breath.   Cardiovascular: Negative for chest pain.  Gastrointestinal: Negative for vomiting and abdominal pain.   Genitourinary: Negative for dysuria.  Musculoskeletal: Negative for back pain.  Skin: Negative for rash.  Neurological: Negative for headaches.  All other systems reviewed and are negative.    Allergies  Penicillins  Home Medications   Current Outpatient Rx  Name  Route  Sig  Dispense  Refill  . buPROPion (WELLBUTRIN SR) 150 MG 12 hr tablet   Oral   Take 150 mg by mouth 2 (two) times daily.         Marland Kitchen EXPIRED: warfarin (COUMADIN) 5 MG tablet   Oral   Take 1 tablet (5 mg total) by mouth daily.   30 tablet   1     Exact dose to be managed based on protocol by Home ...     BP 142/82  Pulse 89  Temp(Src) 98.2 F (36.8 C) (Oral)  Resp 24  SpO2 100%  Physical Exam  Constitutional: She is oriented to person, place, and time. She appears well-developed and well-nourished.  HENT:  Head: Normocephalic and atraumatic.  Eyes: EOM are normal. Pupils are equal, round, and reactive to light.  Neck: Neck supple.  No cervical spine tenderness or deformity  Cardiovascular: Normal rate, regular rhythm and intact distal pulses.   Pulmonary/Chest: Effort normal. No respiratory distress.  Musculoskeletal: Normal range of motion. She exhibits no edema.  Right lower extremity with tenderness, swelling and deformity to the proximal anterior tibial area with skin intact. Distal neurovascular intact. No tenderness over hip with pelvis stable.  No thoracic, lumbar or sacral tenderness  Neurological: She is alert and oriented to person, place, and time.  Skin: Skin is warm and dry.    ED Course  Procedures (including critical care time)  Labs Reviewed - No data to display Dg Tibia/fibula Right  05/13/2012  *RADIOLOGY REPORT*  Clinical Data: Right lower leg pain from injury last night.  The patient fell.  RIGHT TIBIA AND FIBULA - 2 VIEW  Comparison: None.  Findings: There is a transverse fracture through the proximal right tibial metaphysis without significant displacement.  There is  also a transverse fracture through the proximal fibula without significant displacement.  Fracture lines do not appear to extend into the knee joint.  The distal tibia and fibula appear intact. Diffuse soft tissue calcifications consistent with dermatomyositis or calcification within sebaceous glands.  No focal bone lesion or bone destruction.  IMPRESSION: Mostly transverse fractures through the proximal metaphyseal regions of the right tibia and fibula.   Original Report Authenticated By: Burman Nieves, M.D.    IV Dilaudid provided with good pain control.  4:51 AM d/w DR Magnus Ivan - will see in the ED   7:06 AM d/w Dr Dion Saucier, plan admit, posterior long leg splint and monitor compartments  MDM   Fall with proximal tibial and fibular fracture  Splint.  IV narcotics pain control         Sunnie Nielsen, MD 05/14/12 [phone]

## 2012-05-13 NOTE — ED Notes (Signed)
Dr Blackman in room with pt.  

## 2012-05-13 NOTE — ED Notes (Signed)
Kirstin, PA called and states that pt will need central line.

## 2012-05-13 NOTE — ED Notes (Signed)
Talked to [place], [place] about central line placement for pt.  Kirstin, PA to call critical care for placement.

## 2012-05-13 NOTE — ED Notes (Signed)
IV team PICC nurse came and assessed for PICC placement.  Even with [place] RN was unable to locate suitable vein for PICC line.  This RN called radiology concerning IR PICC placement.  Staff states that they do not call in IR team for PICC placement on the weekends.  Kirstin, PA paged to discuss.

## 2012-05-13 NOTE — ED Notes (Signed)
IV team to start attempt another IV.  Initial IV not working.

## 2012-05-13 NOTE — ED Notes (Signed)
Regular lunch tray ordered for pt per verbal permission from [place], [place].  PA states pt will not have surgery today.

## 2012-05-13 NOTE — ED Notes (Addendum)
Per EMS.  Pt was walking down attic stairs when they collapsed.  Pain specifically in right mid shin.  Foot appears to be outwardly rotated.  PT given 50 mcg fentanyl IM by EMS

## 2012-05-13 NOTE — Progress Notes (Signed)
Orthopedic Tech Progress Note Patient Details:  Jessica Richardson Sep 25, 1970 [id number]  Ortho Devices Type of Ortho Device: Long leg splint Ortho Device/Splint Interventions: Application   Shawnie Pons 05/13/2012, 10:38 AM

## 2012-05-13 NOTE — Progress Notes (Signed)
Assessed PT for PICC placement.  No suitable veins.  Left cephalic PICC would occupy greater than 100% of vein.  Unable to locate Left brachial or basilic vein.  Rt cephalic and Basilic veins PICC would occupy greater than 50% and too deep for PIV.  Unable to locate Right Brachial vein.  Assessed for PIV with Ultrasound.  Unable to find any suitable vein to place PIV.

## 2012-05-13 NOTE — H&P (Signed)
Jessica Richardson is an 42 y.o. female.   Chief Complaint: right leg pain HPI: [id number] was getting things out of her attic today when the stairs gave way.  She fell.  She denies loss of consciousness.  Brisk capillary refill.  Normal sensation.  2+DP pulse.  No pain except in right knee  Past Medical History  Diagnosis Date  . Arthritis   . Diabetes mellitus     diet conttrolled  only,no meds ever  . Hip arthritis, left, post-SCFE 03/29/2011  . Depression     Past Surgical History  Procedure Laterality Date  . Abdominal hysterectomy    . Appendectomy    . Tonsillectomy    . Cesarean section    . Joint replacement      pins both hips  . Total hip arthroplasty  03/29/2011    Procedure: TOTAL HIP ARTHROPLASTY;  Surgeon: Eulas Post;  Location: [facility] OR;  Service: Orthopedics;  Laterality: Left;  with removal of retained hardware (knowles pins)    History reviewed. No pertinent family history. Social History:  reports that she quit smoking about 13 months ago. She does not have any smokeless tobacco history on file. She reports that  drinks alcohol. She reports that she does not use illicit drugs.  Allergies:  Allergies  Allergen Reactions  . Penicillins Hives   Home medication: Wellbutrin XL  150 mg po BID  No results found for this or any previous visit (from the past 48 hour(s)). Dg Tibia/fibula Right  05/13/2012  *RADIOLOGY REPORT*  Clinical Data: Right lower leg pain from injury last night.  The patient fell.  RIGHT TIBIA AND FIBULA - 2 VIEW  Comparison: None.  Findings: There is a transverse fracture through the proximal right tibial metaphysis without significant displacement.  There is also a transverse fracture through the proximal fibula without significant displacement.  Fracture lines do not appear to extend into the knee joint.  The distal tibia and fibula appear intact. Diffuse soft tissue calcifications consistent with dermatomyositis or calcification within sebaceous  glands.  No focal bone lesion or bone destruction.  IMPRESSION: Mostly transverse fractures through the proximal metaphyseal regions of the right tibia and fibula.   Original Report Authenticated By: Burman Nieves, M.D.     Review of Systems  Constitutional: Negative.   HENT: Negative.  Negative for neck pain.   Eyes: Negative.   Respiratory: Negative.   Cardiovascular: Negative.   Gastrointestinal: Negative.   Genitourinary: Negative.   Musculoskeletal: Negative for myalgias and back pain.       Right knee pain prior to fall.  Stairs in attic gave way causing fall today.  Skin: Negative.   Neurological: Negative.   Endo/Heme/Allergies: Negative.   Psychiatric/Behavioral: Positive for depression. Negative for suicidal ideas, hallucinations, memory loss and substance abuse. The patient is not nervous/anxious and does not have insomnia.     Blood pressure 135/52, pulse 92, temperature 98.2 F (36.8 C), temperature source Oral, resp. rate 16, SpO2 100.00%. Physical Exam  Constitutional: She is oriented to person, place, and time. She appears well-developed and well-nourished.  HENT:  Head: Normocephalic and atraumatic.  Mouth/Throat: Oropharynx is clear and moist.  Eyes: Conjunctivae and EOM are normal. Pupils are equal, round, and reactive to light.  Neck: Neck supple.  Cardiovascular: Normal rate and regular rhythm.   Respiratory: Effort normal.  GI: Soft.  Genitourinary:  Not pertinent to current symptomatology therefore not examined.  Musculoskeletal:  Right leg in posterior long leg splint.  2+ dorsalis pedis pulse.  Normal sensation.  Moderate swelling  Neurological: She is alert and oriented to person, place, and time.  Skin: Skin is warm.  Psychiatric: She has a normal mood and affect. Her behavior is normal. Judgment and thought content normal.     Assessment Principal Problem:   Fracture, tibia and fibula, proximal Active Problems:   Hip arthritis, left,  post-SCFE, total hip Jan 2013 by Dr Dion Saucier   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   Plan Pt is being admitted as an inpatient for monitoring for compartment syndrome.  CT scan for surgical planning.  Plan on surgery Monday PM.  Regular diet until Sunday at midnight.    Adela Esteban J 05/13/2012, 11:03 AM

## 2012-05-13 NOTE — ED Notes (Signed)
Discussed with Dr. Effie Shy the issues with pt not having IV access.  Dr. Effie Shy advised that pt is an admitted pt and he will not be starting a central line.  Kirstin, PA paged again to advise.

## 2012-05-14 LAB — CBC
HCT: 30.6 % — ABNORMAL LOW (ref 36.0–46.0)
Hemoglobin: 10.1 g/dL — ABNORMAL LOW (ref 12.0–15.0)
MCH: 29.9 pg (ref 26.0–34.0)
MCHC: 33 g/dL (ref 30.0–36.0)
MCV: 90.5 fL (ref 78.0–100.0)
Platelets: 239 K/uL (ref 150–400)
RBC: 3.38 MIL/uL — ABNORMAL LOW (ref 3.87–5.11)
RDW: 13.6 % (ref 11.5–15.5)
WBC: 4.5 K/uL (ref 4.0–10.5)

## 2012-05-14 MED ORDER — VANCOMYCIN HCL 10 G IV SOLR
1500.0000 mg | INTRAVENOUS | Status: AC
  Administered 2012-05-15: 1500 mg via INTRAVENOUS
  Filled 2012-05-14: qty 1500

## 2012-05-14 NOTE — Progress Notes (Signed)
Orthopedic Tech Progress Note Patient Details:  Jessica Richardson December 16, 1970 [id number]  Ortho Devices Type of Ortho Device: Long leg splint Ortho Device/Splint Interventions: Application   Cammer, Mickie Bail 05/14/2012, 12:44 PM  TRAPEZE Eston Esters

## 2012-05-14 NOTE — Progress Notes (Signed)
Patient ID: Jessica Richardson, female   DOB: January 24, 1971, 42 y.o.   MRN: [id number] Jessica Richardson is comfortable.  No acute changes overnight.  She denies right foot numbness.  She moves her toes easily.  Her foot is well-perfused.  Her calf and leg is soft.

## 2012-05-14 NOTE — Progress Notes (Signed)
Subjective: Patient doing well today   Pain under control with IV and po pain meds.  Awaiting surgery tomorrow  Objective: Vital signs in last 24 hours: Temp:  [98.4 F (36.9 C)-98.6 F (37 C)] 98.5 F (36.9 C) (02/23 0500) Pulse Rate:  [88-100] 100 (02/23 0500) Resp:  [16-18] 18 (02/23 0500) BP: (109-138)/(35-72) 133/35 mmHg (02/23 0500) SpO2:  [95 %-100 %] 95 % (02/23 0500) Weight:  [141.522 kg (312 lb)] 141.522 kg (312 lb) (02/23 0300)  Intake/Output from previous day: 02/22 0701 - 02/23 0700 In: 1780 [P.O.:1600; I.V.:180] Out: 1400 [Urine:1400] Intake/Output this shift:     Recent Labs  05/13/12 1710 05/14/12 0500  HGB 10.2* 10.1*    Recent Labs  05/13/12 1710 05/14/12 0500  WBC 5.0 4.5  RBC 3.42* 3.38*  HCT 30.7* 30.6*  PLT 252 239    Recent Labs  05/13/12 1710  NA 138  K 3.7  CL 104  CO2 27  BUN 13  CREATININE 0.78  GLUCOSE 137*  CALCIUM 8.5    Recent Labs  05/13/12 1710  INR 1.13    ABD soft Neurovascular intact Sensation intact distally Intact pulses distally Dorsiflexion/Plantar flexion intact Compartment soft  Assessment/Plan: Principal Problem:   Fracture, tibia and fibula, proximal Active Problems:   Hip arthritis, left, post-SCFE, total hip Jan 2013 by Dr Dion Saucier   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   Hypovolemia  NPO after midnight and to OR tomorrow afternoon with Suszanne Finch 05/14/2012, 8:14 AM

## 2012-05-15 ENCOUNTER — Inpatient Hospital Stay (HOSPITAL_COMMUNITY): Payer: Medicaid Other

## 2012-05-15 ENCOUNTER — Encounter (HOSPITAL_COMMUNITY)
Admission: EM | Disposition: A | Discharge status: Home Health | Payer: Self-pay | Source: Home / Self Care | Attending: Orthopedic Surgery

## 2012-05-15 ENCOUNTER — Encounter (HOSPITAL_COMMUNITY): Payer: Self-pay | Admitting: Anesthesiology

## 2012-05-15 ENCOUNTER — Inpatient Hospital Stay (HOSPITAL_COMMUNITY): Payer: Medicaid Other | Admitting: Anesthesiology

## 2012-05-15 HISTORY — PX: TIBIA IM NAIL INSERTION: SHX2516

## 2012-05-15 LAB — CBC
HCT: 31.3 % — ABNORMAL LOW (ref 36.0–46.0)
MCV: 89.9 fL (ref 78.0–100.0)
RDW: 13.5 % (ref 11.5–15.5)
WBC: 7.1 10*3/uL (ref 4.0–10.5)

## 2012-05-15 LAB — GLUCOSE, CAPILLARY
Glucose-Capillary: 138 mg/dL — ABNORMAL HIGH (ref 70–99)
Glucose-Capillary: 109 mg/dL — ABNORMAL HIGH (ref 70–99)

## 2012-05-15 LAB — URINE CULTURE

## 2012-05-15 LAB — CREATININE, SERUM: GFR calc Af Amer: >90 mL/min (ref >90)

## 2012-05-15 SURGERY — INSERTION, INTRAMEDULLARY ROD, TIBIA
Anesthesia: General | Site: Leg Lower | Laterality: Right | Wound class: Clean

## 2012-05-15 MED ORDER — LIDOCAINE HCL (CARDIAC) 20 MG/ML IV SOLN
INTRAVENOUS | Status: DC | PRN
  Administered 2012-05-15: 80 mg via INTRAVENOUS

## 2012-05-15 MED ORDER — VANCOMYCIN HCL IN DEXTROSE 1-5 GM/200ML-% IV SOLN: 1000.0000 mg | Freq: Once | INTRAVENOUS | Status: DC

## 2012-05-15 MED ORDER — ONDANSETRON HCL 4 MG PO TABS: 4.0000 mg | ORAL_TABLET | Freq: Four times a day (QID) | ORAL | Status: DC | PRN

## 2012-05-15 MED ORDER — OXYCODONE-ACETAMINOPHEN 5-325 MG PO TABS
1.0000 | ORAL_TABLET | ORAL | Status: DC | PRN
  Administered 2012-05-16 – 2012-05-20 (×16): 2 via ORAL
  Filled 2012-05-15 (×18): qty 2

## 2012-05-15 MED ORDER — HYDROMORPHONE HCL PF 1 MG/ML IJ SOLN
0.5000 mg | INTRAMUSCULAR | Status: DC | PRN
  Administered 2012-05-15 – 2012-05-17 (×10): 1 mg via INTRAVENOUS
  Filled 2012-05-15 (×10): qty 1

## 2012-05-15 MED ORDER — METHOCARBAMOL 500 MG PO TABS
500.0000 mg | ORAL_TABLET | Freq: Four times a day (QID) | ORAL | Status: DC | PRN
  Administered 2012-05-16 – 2012-05-20 (×9): 500 mg via ORAL
  Filled 2012-05-15 (×9): qty 1

## 2012-05-15 MED ORDER — VANCOMYCIN HCL 10 G IV SOLR
1500.0000 mg | Freq: Once | INTRAVENOUS | Status: DC
  Filled 2012-05-15: qty 1500

## 2012-05-15 MED ORDER — NEOSTIGMINE METHYLSULFATE 1 MG/ML IJ SOLN
INTRAMUSCULAR | Status: DC | PRN
  Administered 2012-05-15: 3 mg via INTRAVENOUS

## 2012-05-15 MED ORDER — METHOCARBAMOL 100 MG/ML IJ SOLN: 500.0000 mg | Freq: Four times a day (QID) | INTRAVENOUS | Status: DC | PRN

## 2012-05-15 MED ORDER — ROCURONIUM BROMIDE 100 MG/10ML IV SOLN
INTRAVENOUS | Status: DC | PRN
  Administered 2012-05-15: 50 mg via INTRAVENOUS

## 2012-05-15 MED ORDER — ZOLPIDEM TARTRATE 5 MG PO TABS: 5.0000 mg | ORAL_TABLET | Freq: Every evening | ORAL | Status: DC | PRN

## 2012-05-15 MED ORDER — DIPHENHYDRAMINE HCL 12.5 MG/5ML PO ELIX: 12.5000 mg | ORAL_SOLUTION | ORAL | Status: DC | PRN

## 2012-05-15 MED ORDER — MIDAZOLAM HCL 5 MG/5ML IJ SOLN
INTRAMUSCULAR | Status: DC | PRN
  Administered 2012-05-15: 1 mg via INTRAVENOUS

## 2012-05-15 MED ORDER — SENNA-DOCUSATE SODIUM 8.6-50 MG PO TABS: 1.0000 | ORAL_TABLET | Freq: Every day | ORAL | Status: DC

## 2012-05-15 MED ORDER — HYDROMORPHONE HCL PF 1 MG/ML IJ SOLN
0.2500 mg | INTRAMUSCULAR | Status: DC | PRN
  Administered 2012-05-15 (×4): 0.5 mg via INTRAVENOUS

## 2012-05-15 MED ORDER — METOCLOPRAMIDE HCL 5 MG/ML IJ SOLN: 5.0000 mg | Freq: Three times a day (TID) | INTRAMUSCULAR | Status: DC | PRN

## 2012-05-15 MED ORDER — OXYCODONE HCL 5 MG PO TABS
5.0000 mg | ORAL_TABLET | ORAL | Status: DC | PRN
  Administered 2012-05-16 – 2012-05-18 (×10): 10 mg via ORAL
  Filled 2012-05-15 (×10): qty 2

## 2012-05-15 MED ORDER — OXYCODONE HCL 5 MG/5ML PO SOLN: 5.0000 mg | Freq: Once | ORAL | Status: DC | PRN

## 2012-05-15 MED ORDER — OXYCODONE-ACETAMINOPHEN 10-325 MG PO TABS: 1.0000 | ORAL_TABLET | Freq: Four times a day (QID) | ORAL | Status: DC | PRN

## 2012-05-15 MED ORDER — ONDANSETRON HCL 4 MG/2ML IJ SOLN
INTRAMUSCULAR | Status: DC | PRN
  Administered 2012-05-15: 4 mg via INTRAVENOUS

## 2012-05-15 MED ORDER — ONDANSETRON HCL 4 MG/2ML IJ SOLN
4.0000 mg | Freq: Four times a day (QID) | INTRAMUSCULAR | Status: DC | PRN
  Administered 2012-05-17: 4 mg via INTRAVENOUS
  Filled 2012-05-15: qty 2

## 2012-05-15 MED ORDER — DOCUSATE SODIUM 100 MG PO CAPS
100.0000 mg | ORAL_CAPSULE | Freq: Two times a day (BID) | ORAL | Status: DC
  Administered 2012-05-15 – 2012-05-20 (×10): 100 mg via ORAL
  Filled 2012-05-15 (×11): qty 1

## 2012-05-15 MED ORDER — ONDANSETRON HCL 4 MG/2ML IJ SOLN: 4.0000 mg | Freq: Four times a day (QID) | INTRAMUSCULAR | Status: DC | PRN

## 2012-05-15 MED ORDER — BISACODYL 10 MG RE SUPP: 10.0000 mg | Freq: Every day | RECTAL | Status: DC | PRN

## 2012-05-15 MED ORDER — ENOXAPARIN SODIUM 40 MG/0.4ML ~~LOC~~ SOLN
40.0000 mg | SUBCUTANEOUS | Status: DC
  Administered 2012-05-16 – 2012-05-20 (×5): 40 mg via SUBCUTANEOUS
  Filled 2012-05-15 (×5): qty 0.4

## 2012-05-15 MED ORDER — DEXTROSE 5 % IV SOLN: 3.0000 g | INTRAVENOUS | Status: DC

## 2012-05-15 MED ORDER — DEXTROSE 5 % IV SOLN
3.0000 g | Freq: Four times a day (QID) | INTRAVENOUS | Status: AC
  Administered 2012-05-15 – 2012-05-16 (×3): 3 g via INTRAVENOUS
  Filled 2012-05-15 (×3): qty 3000

## 2012-05-15 MED ORDER — CEFAZOLIN SODIUM 1-5 GM-% IV SOLN
INTRAVENOUS | Status: DC | PRN
  Administered 2012-05-15 (×3): 1 g via INTRAVENOUS

## 2012-05-15 MED ORDER — ARTIFICIAL TEARS OP OINT
TOPICAL_OINTMENT | OPHTHALMIC | Status: DC | PRN
  Administered 2012-05-15: 1 via OPHTHALMIC

## 2012-05-15 MED ORDER — FENTANYL CITRATE 0.05 MG/ML IJ SOLN
INTRAMUSCULAR | Status: DC | PRN
  Administered 2012-05-15 (×10): 50 ug via INTRAVENOUS

## 2012-05-15 MED ORDER — GLYCOPYRROLATE 0.2 MG/ML IJ SOLN
INTRAMUSCULAR | Status: DC | PRN
  Administered 2012-05-15: 0.4 mg via INTRAVENOUS

## 2012-05-15 MED ORDER — POTASSIUM CHLORIDE IN NACL 20-0.45 MEQ/L-% IV SOLN
INTRAVENOUS | Status: DC
  Administered 2012-05-15 – 2012-05-19 (×6): via INTRAVENOUS
  Filled 2012-05-15 (×10): qty 1000

## 2012-05-15 MED ORDER — LACTATED RINGERS IV SOLN
INTRAVENOUS | Status: DC | PRN
  Administered 2012-05-15 (×2): via INTRAVENOUS

## 2012-05-15 MED ORDER — OXYCODONE HCL 5 MG PO TABS: 5.0000 mg | ORAL_TABLET | Freq: Once | ORAL | Status: DC | PRN

## 2012-05-15 MED ORDER — SENNA 8.6 MG PO TABS
1.0000 | ORAL_TABLET | Freq: Two times a day (BID) | ORAL | Status: DC
  Administered 2012-05-15 – 2012-05-20 (×10): 8.6 mg via ORAL
  Filled 2012-05-15 (×11): qty 1

## 2012-05-15 MED ORDER — 0.9 % SODIUM CHLORIDE (POUR BTL) OPTIME
TOPICAL | Status: DC | PRN
  Administered 2012-05-15: 1000 mL

## 2012-05-15 MED ORDER — METOCLOPRAMIDE HCL 10 MG PO TABS: 5.0000 mg | ORAL_TABLET | Freq: Three times a day (TID) | ORAL | Status: DC | PRN

## 2012-05-15 MED ORDER — PROPOFOL 10 MG/ML IV BOLUS
INTRAVENOUS | Status: DC | PRN
  Administered 2012-05-15: 150 mg via INTRAVENOUS

## 2012-05-15 MED ORDER — POLYETHYLENE GLYCOL 3350 17 G PO PACK
17.0000 g | PACK | Freq: Every day | ORAL | Status: DC | PRN
  Administered 2012-05-18: 17 g via ORAL
  Filled 2012-05-15: qty 1

## 2012-05-15 MED ORDER — METHOCARBAMOL 500 MG PO TABS: 500.0000 mg | ORAL_TABLET | Freq: Four times a day (QID) | ORAL | Status: DC

## 2012-05-15 MED ORDER — ENOXAPARIN SODIUM 40 MG/0.4ML ~~LOC~~ SOLN: 40.0000 mg | SUBCUTANEOUS | Status: DC

## 2012-05-15 MED ORDER — WHITE PETROLATUM GEL
Status: AC
  Administered 2012-05-15: 21:00:00
  Filled 2012-05-15: qty 5

## 2012-05-15 SURGICAL SUPPLY — 73 items
BANDAGE ELASTIC 4 VELCRO ST LF (GAUZE/BANDAGES/DRESSINGS) ×2 IMPLANT
BANDAGE ELASTIC 6 VELCRO ST LF (GAUZE/BANDAGES/DRESSINGS) ×2 IMPLANT
BANDAGE ESMARK 6X9 LF (GAUZE/BANDAGES/DRESSINGS) IMPLANT
BANDAGE GAUZE ELAST BULKY 4 IN (GAUZE/BANDAGES/DRESSINGS) ×2 IMPLANT
BIT DRILL 100X2.5XANTM LCK (BIT) ×1 IMPLANT
BIT DRILL CAL (BIT) ×1 IMPLANT
BIT DRILL SLEEVE MEASURING (BIT) ×1 IMPLANT
BIT DRL 100X2.5XANTM LCK (BIT) ×1
BLADE SURG 15 STRL LF DISP TIS (BLADE) ×1 IMPLANT
BLADE SURG 15 STRL SS (BLADE) ×1
BLADE SURG ROTATE 9660 (MISCELLANEOUS) IMPLANT
BNDG COHESIVE 6X5 TAN STRL LF (GAUZE/BANDAGES/DRESSINGS) ×2 IMPLANT
BNDG ELASTIC 6X15 VLCR STRL LF (GAUZE/BANDAGES/DRESSINGS) ×2 IMPLANT
BNDG ESMARK 6X9 LF (GAUZE/BANDAGES/DRESSINGS)
BOOTCOVER CLEANROOM LRG (PROTECTIVE WEAR) ×4 IMPLANT
CLOTH BEACON ORANGE TIMEOUT ST (SAFETY) ×2 IMPLANT
COVER SURGICAL LIGHT HANDLE (MISCELLANEOUS) ×4 IMPLANT
CUFF TOURNIQUET SINGLE 34IN LL (TOURNIQUET CUFF) IMPLANT
CUFF TOURNIQUET SINGLE 44IN (TOURNIQUET CUFF) IMPLANT
DRAPE C-ARM 42X72 X-RAY (DRAPES) ×2 IMPLANT
DRAPE ORTHO SPLIT 77X108 STRL (DRAPES) ×2
DRAPE PROXIMA HALF (DRAPES) ×4 IMPLANT
DRAPE SURG ORHT 6 SPLT 77X108 (DRAPES) ×2 IMPLANT
DRAPE U-SHAPE 47X51 STRL (DRAPES) ×2 IMPLANT
DRILL BIT 2.5MM (BIT) ×1
DRILL BIT CAL (BIT) ×2
DRILL SLEEVE MEASURING (BIT) ×2
DRSG ADAPTIC 3X8 NADH LF (GAUZE/BANDAGES/DRESSINGS) ×2 IMPLANT
DRSG PAD ABDOMINAL 8X10 ST (GAUZE/BANDAGES/DRESSINGS) ×2 IMPLANT
DURAPREP 26ML APPLICATOR (WOUND CARE) ×2 IMPLANT
ELECT REM PT RETURN 9FT ADLT (ELECTROSURGICAL) ×2
ELECTRODE REM PT RTRN 9FT ADLT (ELECTROSURGICAL) ×1 IMPLANT
FACESHIELD LNG OPTICON STERILE (SAFETY) IMPLANT
GLOVE BIOGEL PI ORTHO PRO SZ8 (GLOVE) ×1
GLOVE ORTHO TXT STRL SZ7.5 (GLOVE) ×2 IMPLANT
GLOVE PI ORTHO PRO STRL SZ8 (GLOVE) ×1 IMPLANT
GLOVE SURG ORTHO 8.0 STRL STRW (GLOVE) ×4 IMPLANT
GOWN STRL NON-REIN LRG LVL3 (GOWN DISPOSABLE) IMPLANT
K-WIRE ACE 1.6X6 (WIRE) ×4
KIT BASIN OR (CUSTOM PROCEDURE TRAY) ×2 IMPLANT
KIT ROOM TURNOVER OR (KITS) ×2 IMPLANT
KWIRE ACE 1.6X6 (WIRE) ×2 IMPLANT
MANIFOLD NEPTUNE II (INSTRUMENTS) ×2 IMPLANT
NS IRRIG 1000ML POUR BTL (IV SOLUTION) ×2 IMPLANT
PACK GENERAL/GYN (CUSTOM PROCEDURE TRAY) ×2 IMPLANT
PAD ARMBOARD 7.5X6 YLW CONV (MISCELLANEOUS) ×4 IMPLANT
PADDING CAST COTTON 6X4 STRL (CAST SUPPLIES) ×2 IMPLANT
PLATE LOCK 9H STD RT PROX TIB (Plate) ×2 IMPLANT
SCREW CORTICAL 3.5MM  30MM (Screw) ×1 IMPLANT
SCREW CORTICAL 3.5MM  46MM (Screw) ×1 IMPLANT
SCREW CORTICAL 3.5MM 30MM (Screw) ×1 IMPLANT
SCREW CORTICAL 3.5MM 36MM (Screw) ×4 IMPLANT
SCREW CORTICAL 3.5MM 46MM (Screw) ×1 IMPLANT
SCREW CORTICAL 3.5MM 48MM (Screw) ×2 IMPLANT
SCREW LOCK CORT STAR 3.5X56 (Screw) ×2 IMPLANT
SCREW LOCK CORT STAR 3.5X60 (Screw) ×2 IMPLANT
SCREW LOCK CORT STAR 3.5X65 (Screw) ×2 IMPLANT
SCREW LOCK CORT STAR 3.5X75 (Screw) ×2 IMPLANT
SCREW LOW PROF CORTICAL 3.5X80 (Screw) ×2 IMPLANT
SCREW LP 3.5X75MM (Screw) ×2 IMPLANT
SPONGE GAUZE 4X4 12PLY (GAUZE/BANDAGES/DRESSINGS) ×2 IMPLANT
STAPLER VISISTAT 35W (STAPLE) ×2 IMPLANT
STOCKINETTE IMPERVIOUS LG (DRAPES) ×2 IMPLANT
SUT MNCRL AB 4-0 PS2 18 (SUTURE) ×2 IMPLANT
SUT VIC AB 0 CT1 18XCR BRD 8 (SUTURE) ×1 IMPLANT
SUT VIC AB 0 CT1 8-18 (SUTURE) ×1
SUT VIC AB 2-0 CT1 27 (SUTURE) ×1
SUT VIC AB 2-0 CT1 TAPERPNT 27 (SUTURE) ×1 IMPLANT
SUT VIC AB 3-0 SH 8-18 (SUTURE) ×2 IMPLANT
TOWEL OR 17X24 6PK STRL BLUE (TOWEL DISPOSABLE) ×2 IMPLANT
TOWEL OR 17X26 10 PK STRL BLUE (TOWEL DISPOSABLE) ×2 IMPLANT
TRAY FOLEY CATH 14FR (SET/KITS/TRAYS/PACK) IMPLANT
WATER STERILE IRR 1000ML POUR (IV SOLUTION) ×2 IMPLANT

## 2012-05-15 NOTE — Anesthesia Preprocedure Evaluation (Addendum)
Anesthesia Evaluation  Patient identified by MRN, date of birth, ID band Patient awake    Reviewed: Allergy & Precautions, H&P , NPO status , Patient's Chart, lab work & pertinent test results  History of Anesthesia Complications Negative for: history of anesthetic complications  Airway Mallampati: II  Neck ROM: full    Dental   Pulmonary former smoker,          Cardiovascular Exercise Tolerance: Good     Neuro/Psych PSYCHIATRIC DISORDERS Depression    GI/Hepatic   Endo/Other  diabetes, Well Controlled, Type obesity  Renal/GU      Musculoskeletal  (+) Arthritis -, Osteoarthritis,    Abdominal   Peds  Hematology   Anesthesia Other Findings Obese   Reproductive/Obstetrics                         Anesthesia Physical Anesthesia Plan  ASA: III  Anesthesia Plan: General   Post-op Pain Management:    Induction: Intravenous  Airway Management Planned: Oral ETT  Additional Equipment:   Intra-op Plan:   Post-operative Plan: Extubation in OR  Informed Consent: I have reviewed the patients History and Physical, chart, labs and discussed the procedure including the risks, benefits and alternatives for the proposed anesthesia with the patient or authorized representative who has indicated his/her understanding and acceptance.   Dental advisory given  Plan Discussed with: CRNA and Anesthesiologist  Anesthesia Plan Comments:         Anesthesia Quick Evaluation

## 2012-05-15 NOTE — Transfer of Care (Signed)
Immediate Anesthesia Transfer of Care Note  Patient: Jessica Richardson  Procedure(s) Performed: Procedure(s): Open Reduction Internal Fixation Right Tibia (Right)  Patient Location: PACU  Anesthesia Type:General  Level of Consciousness: awake, alert  and oriented  Airway & Oxygen Therapy: Patient Spontanous Breathing and Patient connected to nasal cannula oxygen  Post-op Assessment: Report given to PACU RN  Post vital signs: Reviewed and stable  Complications: No apparent anesthesia complications

## 2012-05-15 NOTE — Progress Notes (Signed)
CARE MANAGEMENT NOTE 05/15/2012  Patient:  Jessica Richardson, Jessica Richardson   Account Number:  [id number]  Date Initiated:  05/15/2012  Documentation initiated by:  Vance Peper  Subjective/Objective Assessment:   42 yr old female admitted with right tib/fib fracture. Will go to surgery today.     Action/Plan:   CM spoke with patient concerning home health needs and DME. Patient has used [facility] and wants to do the same now. States she has rolling walker and 3in1 already.   Anticipated DC Date:  05/18/2012   Anticipated DC Plan:  HOME W HOME HEALTH SERVICES      DC Planning Services  CM consult      [facility] Choice  HOME HEALTH   Choice offered to / List presented to:  C-1 Patient        HH arranged  HH-2 PT      [facility] agency  [facility]   Status of service:  In process, will continue to follow Medicare Important Message given?   (If response is "NO", the following Medicare IM given date fields will be blank) Date Medicare IM given:   Date Additional Medicare IM given:    Discharge Disposition:    Per UR Regulation:    If discussed at Long Length of Stay Meetings, dates discussed:    Comments:

## 2012-05-15 NOTE — Anesthesia Postprocedure Evaluation (Signed)
  Anesthesia Post-op Note  Patient: Jessica Richardson  Procedure(s) Performed: Procedure(s): Open Reduction Internal Fixation Right Tibia (Right)  Patient Location: PACU  Anesthesia Type:General  Level of Consciousness: awake and sedated  Airway and Oxygen Therapy: Patient Spontanous Breathing  Post-op Pain: mild  Post-op Assessment: Post-op Vital signs reviewed  Post-op Vital Signs: stable  Complications: No apparent anesthesia complications

## 2012-05-15 NOTE — Op Note (Signed)
6:00 PM  PATIENT:  Jessica Richardson    PRE-OPERATIVE DIAGNOSIS:  Right bicondylar proximal tibial plateau fracture  POST-OPERATIVE DIAGNOSIS:  Same  PROCEDURE:  Open Reduction Internal Fixation Right Tibia, bicondylar tibial plateau  SURGEON:  Eulas Post, MD  PHYSICIAN ASSISTANT: Janace Litten, OPA-C, present and scrubbed throughout the case, critical for completion in a timely fashion, and for retraction, instrumentation, and closure.  ANESTHESIA:   General  PREOPERATIVE INDICATIONS:  Jessica Richardson is a  42 y.o. female with a diagnosis of right bicondylar tibial plateau fracture who is morbidly obese, and not amenable to splint or cast immobilization, who elected for surgical management.  She had preoperative osteoarthritis of the right knee, with intermittent symptoms, and she fell through steps that gave way causing the plateau fracture. She also has morbid obesity, and is status post total hip arthroplasty.  The risks benefits and alternatives were discussed with the patient including but not limited to the risks of nonoperative treatment, versus surgical intervention including infection, bleeding, nerve injury, malunion, nonunion, the need for revision surgery, hardware prominence, hardware failure, the need for hardware removal, blood clots, cardiopulmonary complications, morbidity, mortality, among others, and they were willing to proceed.     OPERATIVE IMPLANTS: Biomet proximal tibial locking plate with multiple proximal locking screws and multiple distal nonlocking screws.  OPERATIVE FINDINGS: Comminuted proximal tibial plateau fracture, primarily extra-articular  OPERATIVE PROCEDURE: The patient was brought to the operating room and placed in the supine position. General anesthesia was administered. I gave her IV vancomycin, do to a history of an MRSA-positive preoperative screening, as well as 3 g of intravenous Ancef, which she tolerated well despite an allergy to  penicillin.  The right lower extremity was prepped and draped in usual sterile fashion. Time out was performed. The leg was elevated and exsanguinated and the tourniquet was inflated. Total tourniquet time was approximately 70 minutes. Lateral incision was made over the tibia, and dissection was carried down elevating the tibialis anterior off of the crest of the tibia. I did not violate the joint, as there was really no major articular segments that I needed to reduce, rather just exposed the proximal tibia, and reduced the fracture using positioning bumps underneath the leg. I applied the plate in the appropriate position, and secured it proximally with K wires to begin with, and distally with a nonlocking screws. Confirmed position of the plate and reduction of the fracture on AP and lateral views. I then secured the plate to the bone using nonlocking screws proximally. This provided excellent compression of the plate to the bone.  I then placed locking screws proximally and then converted one of the nonlocking screws to a locking screw. The remaining nonlocking screw was bicortical and had excellent fixation and was left in place.  I used both kickstand screws distally, given the complex bicondylar nature of the fracture, as well as my concern of her morbid obesity and potential for stressing the fracture site. The plate had 4 distal cortical screws, with multiple proximal locking screws.  Excellent overall fixation was achieved, I irrigated the wounds copiously and repaired the fascia with 0 Vicryl, followed by 2-0 for the subcutaneous tissue, with Monocryl with Steri-Strips and sterile gauze for the skin. The tourniquet was released. The patient was awakened and returned back in stable and satisfactory condition. We will also utilize a knee immobilizer, if possible, although it may not fit her very well. She will be nonweightbearing for a period of approximately  2 months.

## 2012-05-15 NOTE — Progress Notes (Signed)
     Subjective:  Patient reports pain as moderate.  She denies any numbness or tingling. This all began after she fell through a stair Friday. She does report having pre-existing knee problems, with periodic episodic pain.  Objective:   VITALS:   Filed Vitals:   05/14/12 1311 05/14/12 2000 05/14/12 2149 05/15/12 0538  BP: 138/58  150/39 137/65  Pulse: 95  95 97  Temp: 98.8 F (37.1 C)  98.3 F (36.8 C) 98.7 F (37.1 C)  TempSrc: Oral     Resp: 18 18 18 18   Height:      Weight:      SpO2: 97% 98% 98% 99%    Neurologically intact Sensation intact distally Dorsiflexion/Plantar flexion intact Compartment soft   Lab Results  Component Value Date   WBC 4.5 05/14/2012   HGB 10.1* 05/14/2012   HCT 30.6* 05/14/2012   MCV 90.5 05/14/2012   PLT 239 05/14/2012     Assessment/Plan:     Principal Problem:   Fracture, tibia and fibula, proximal Active Problems:   Hip arthritis, left, post-SCFE, total hip Jan 2013 by Dr Dion Saucier   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   Weight as of this encounter: 141.522 kg (312 lb).  This is an acute severe injury, and is fairly challenging to manage given the fact that there is some pre-existing arthritis within the knee, as well as the proximal nature of the fracture, particularly the intra-articular component.  The size and shape of her leg makes cast treatment contraindicated, and unfortunately this means that we will need to perform some type of surgical intervention, I recommended intramedullary nail fixation with possible percutaneous screw fixation to help augment the intra-articular component. I am reluctant to recommend plate fixation, but will have this as an option.  The risks benefits and alternatives were discussed with the patient including but not  limited to the risks of nonoperative treatment, versus surgical intervention including infection, bleeding, nerve injury, malunion, nonunion, the need for revision surgery, hardware prominence, hardware failure, the need for hardware removal, blood clots, cardiopulmonary complications, morbidity, mortality, among others, and they were willing to proceed.    This fracture does have risk for compartment syndrome, and she has been monitored over the weekend for this, and I appreciate Dr. Eliberto Ivory assistance with this. She will need to be monitored postoperatively for probably at least 24-48 hours, and we will also be working with physical therapy.      Maitland Muhlbauer P 05/15/2012, 8:26 AM   Teryl Lucy, MD Cell [phone] Pager [phone]

## 2012-05-16 ENCOUNTER — Encounter (HOSPITAL_COMMUNITY): Payer: Self-pay | Admitting: Orthopedic Surgery

## 2012-05-16 LAB — GLUCOSE, CAPILLARY
Glucose-Capillary: 126 mg/dL — ABNORMAL HIGH (ref 70–99)
Glucose-Capillary: 155 mg/dL — ABNORMAL HIGH (ref 70–99)

## 2012-05-16 NOTE — Progress Notes (Signed)
Patient ID: Jessica Richardson, female   DOB: [birth year], 42 y.o.   MRN: [id number]     Subjective:  Patient reports pain as mild to moderate.  Patient sitting on the side of the bed about to start PT  Objective:   VITALS:   Filed Vitals:   05/15/12 2042 05/15/12 2358 05/16/12 0612 05/16/12 0800  BP:  145/86 140/82 112/72  Pulse:  88 85 88  Temp:  97.7 F (36.5 C) 98.6 F (37 C) 97.5 F (36.4 C)  TempSrc:    Oral  Resp:  18 18 20   Height:      Weight:      SpO2: 99% 99% 99% 100%    ABD soft Sensation intact distally Dorsiflexion/Plantar flexion intact Incision: dressing C/D/I and no drainage   Lab Results  Component Value Date   WBC 7.1 05/15/2012   HGB 10.4* 05/15/2012   HCT 31.3* 05/15/2012   MCV 89.9 05/15/2012   PLT 221 05/15/2012     Assessment/Plan: 1 Day Post-Op   Principal Problem:   Closed fracture of right fibula and tibia Active Problems:   Hip arthritis, left, post-SCFE, total hip Jan 2013 by Dr Dion Saucier   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 Knee immobilizer at all times   Jessica Richardson 05/16/2012, 11:56 AM   Jessica Lucy, MD Cell [phone] Pager [phone]

## 2012-05-16 NOTE — Evaluation (Addendum)
Occupational Therapy Evaluation Patient Details Name: Jessica Richardson MRN: [id number] DOB: 04/21/1970 Today's Date: 05/16/2012 Time: 9629-5284 OT Time Calculation (min): 27 min  OT Assessment / Plan / Recommendation Clinical Impression  Pt is a 42 y/o female s/p ORIF R tibia secondary to bicondylar proximal tibial plateau fracture. She is currently NWB RLE x2 months (per MD notes) and requires KI at all times. ADL and self care is currently impaired and pt lives alone. Reommend acute OT followed by SNF rehab to assist in maximizing independence w/ ADL's and functional mobility/transfers    OT Assessment  Patient needs continued OT Services    Follow Up Recommendations  SNF    Barriers to Discharge Other (comment) (Lives alone)    Equipment Recommendations  None recommended by OT    Recommendations for Other Services    Frequency  Min 3X/week    Precautions / Restrictions Precautions Falls Required Braces or Orthoses: Knee Immobilizer - Right Knee Immobilizer - Right: On at all times Restrictions Weight Bearing Restrictions: Yes RLE Weight Bearing: Non weight bearing Other Position/Activity Restrictions: 2 months NWB per MD orders   Pertinent Vitals/Pain 10/10 RLE/Knee pain. RN states Pt was premedicated prior to assessment and pt was agreeable to assessment and transfer to chair/repositioning.    ADL  Eating/Feeding: Performed;Set up Where Assessed - Eating/Feeding: Chair Grooming: Performed;Wash/dry hands;Set up Where Assessed - Grooming: Supported sitting Upper Body Bathing: Simulated;Set up;Supervision/safety;Min guard Where Assessed - Upper Body Bathing: Supported sitting Lower Body Bathing: Simulated;+1 Total assistance Where Assessed - Lower Body Bathing: Supine, head of bed up Upper Body Dressing: Performed;Set up;Minimal assistance Where Assessed - Upper Body Dressing: Unsupported sitting Lower Body Dressing: Performed;+1 Total assistance Where Assessed - Lower  Body Dressing: Unsupported sitting Toilet Transfer: Simulated;Minimal assistance;Moderate assistance Toilet Transfer Method: Sit to stand;Stand pivot Toilet Transfer Equipment: Extra wide bedside commode Toileting - Clothing Manipulation and Hygiene: Simulated;Minimal assistance Where Assessed - Toileting Clothing Manipulation and Hygiene: Sit to stand from 3-in-1 or toilet;Standing Tub/Shower Transfer Method: Not assessed Equipment Used: Gait belt;Rolling walker;Other (comment) (Pt will need extra wide 3:1 in room) Transfers/Ambulation Related to ADLs: Pt moves slowly during transfers and bed mobility but prefers to attempt by herself secondary to pain RLE. Overall pt was Min-Mod assist to guide RLE and w/ VC's for NWB RLE and safety, sequencing w/ RW as well as for hand placement. ADL Comments: Pt is a 42 y/o female s/p ORIF R tibia secondary to bicondylar proximal tibial plateau fracture. She is currently NWB RLE x2 months (per MD notes) and requires KI at all times. ADL and self care is currently impaired and pt lives alone. Reommend acute OT followed by SNF rehab to assist in maximizing independence w/ ADL's and functional mobility/transfers.    OT Diagnosis: Generalized weakness;Acute pain;Other (comment) (NWB RLE)  OT Problem List: Decreased activity tolerance;Decreased knowledge of use of DME or AE;Decreased knowledge of precautions;Obesity;Pain;Impaired balance (sitting and/or standing);Other (comment) (NWB RLE) OT Treatment Interventions: Self-care/ADL training;Energy conservation;DME and/or AE instruction;Therapeutic activities;Patient/family education;Balance training   OT Goals Acute Rehab OT Goals Potential to Achieve Goals: Good  Visit Information  Last OT Received On: 05/16/12 Assistance Needed: +2 Reason Eval/Treat Not Completed: Pain limiting ability to participate    Subjective Data  Subjective: Pt reports pain R LE as 10/10 note: pt RN states that she was premedicated  before hand and pt agreeable to OT assessment and up to chair.   Prior Functioning     Home Living Lives With: Alone  Type of Home: House Home Access: Stairs to enter [employer] of Steps: 3 STE Entrance Stairs-Rails: None Home Layout: One level Bathroom Shower/Tub: Forensic scientist: Standard Bathroom Accessibility: Yes How Accessible: Accessible via wheelchair;Accessible via walker Home Adaptive Equipment: Walker - rolling Prior Function Level of Independence: Independent Able to Take Stairs?: Yes Driving: Yes Vocation: Other (comment) (Pt states that she does not work) Musician: No difficulties Dominant Hand: Right    Vision/Perception Vision - History Patient Visual Report: No change from baseline   Cognition  Cognition Overall Cognitive Status: Appears within functional limits for tasks assessed/performed Arousal/Alertness: Awake/alert Orientation Level: Appears intact for tasks assessed Behavior During Session: [facility] for tasks performed    Extremity/Trunk Assessment Right Upper Extremity Assessment RUE ROM/Strength/Tone: Within functional levels RUE Sensation: WFL - Light Touch RUE Coordination: WFL - gross/fine motor Left Upper Extremity Assessment LUE ROM/Strength/Tone: Within functional levels LUE Sensation: WFL - Light Touch LUE Coordination: WFL - gross/fine motor     Mobility Bed Mobility Bed Mobility: Supine to Sit;Sitting - Scoot to Edge of Bed Supine to Sit: 4: Min assist;HOB elevated (RLE ) Sitting - Scoot to Edge of Bed: 4: Min assist (to guide RLE off of bed ) Details for Bed Mobility Assistance: Pt moves slowly, but prefers to attempt bed mobility I'ly. She did require Min assist to guide R leg/foot (pt holding onto KI during trasnfer). Transfers Transfers: Sit to Stand;Stand to Sit Sit to Stand: 4: Min assist;3: Mod assist;From bed;From chair/3-in-1;With upper extremity assist (VC's for hand  placement and NWB RLE) Stand to Sit: 4: Min assist;3: Mod assist;To chair/3-in-1 (Assist to guide RLE, pt moves slowly) Details for Transfer Assistance: Pt requires VC's for hand placement, safety and NWB RLE during transfer.        Balance Balance Balance Assessed: Yes Static Sitting Balance Static Sitting - Balance Support: Bilateral upper extremity supported (LLE supported, RLE NWB, VC's and occasional assist support  ) Static Sitting - Level of Assistance: 5: Stand by assistance   End of Session OT - End of Session Equipment Utilized During Treatment: Gait belt;Right knee immobilizer;Other (comment) (RW) Patient left: in chair;with call bell/phone within reach  GO     Alm Bustard 05/16/2012, 12:49 PM

## 2012-05-16 NOTE — Progress Notes (Signed)
UR COMPLETED  

## 2012-05-16 NOTE — Clinical Social Work Placement (Addendum)
Clinical Social Work Department  CLINICAL SOCIAL WORK PLACEMENT NOTE  05/16/2012  Patient: Jessica Richardson Account Number: [id number] Admit date: 05/13/12  Clinical Social Worker: Sabino Niemann, MSW Date/time: 05/16/2012 1:00 PM  Clinical Social Work is seeking post-discharge placement for this patient at the following level of care: SKILLED NURSING (*CSW will update this form in Epic as items are completed)  05/16/2012 Patient/family provided with [facility] Department of Clinical Social Work's list of facilities offering this level of care within the geographic area requested by the patient (or if unable, by the patient's family).  05/16/2012 Patient/family informed of their freedom to choose among providers that offer the needed level of care, that participate in Medicare, Medicaid or managed care program needed by the patient, have an available bed and are willing to accept the patient.  05/16/2012 Patient/family informed of [facility]' ownership interest in [facility], as well as of the fact that they are under no obligation to receive care at this facility.  PASARR submitted to EDS on  PASARR number received from EDS on  FL2 transmitted to all facilities in geographic area requested by pt/family on 05/16/2012  FL2 transmitted to all facilities within larger geographic area on  Patient informed that his/her managed care company has contracts with or will negotiate with certain facilities, including the following:  Patient/family informed of bed offers received:  Patient chooses bed at  Physician recommends and patient chooses bed at  Patient to be transferred to on  Patient to be transferred to facility by  The following physician request were entered in Epic:  Additional Comments:

## 2012-05-16 NOTE — Clinical Social Work Psychosocial (Signed)
Clinical Social Work Department  BRIEF PSYCHOSOCIAL ASSESSMENT  Patient: TABETHA HARAWAY  Account Number: [id number]  Admit date: 05/13/12 Clinical Social Worker Sabino Niemann, MSW Date/Time: 05/16/2012 2:30 PM Referred by: Physician Date Referred: 05/15/12 Referred for   SNF Placement   Other Referral:  Interview type: Patient  Other interview type: PSYCHOSOCIAL DATA  Living Status: Patient reports she lives alone Admitted from facility:  Level of care:  Primary support name: Hayden,Jean   Primary support relationship to patient: Mother Degree of support available:  Strong and vested  CURRENT CONCERNS  Current Concerns   Post-Acute Placement   Other Concerns:  SOCIAL WORK ASSESSMENT / PLAN  CSW met with pt re: PT recommendation for SNF.   Pt live alone-reported  CSW explained placement process and answered questions.   Pt reports no preference at this time    CSW completed FL2 and initiated SNF search.     Assessment/plan status: Information/Referral to [facility]  Other assessment/ plan:  Information/referral to community resources:  SNF   PTAR   PATIENT'S/FAMILY'S RESPONSE TO PLAN OF CARE:  Pt  reports she is agreeable to ST SNF in order to increase strength and independence with mobility prior to return home  Pt verbalized understanding of placement process and appreciation for CSW assist.   Sabino Niemann, MSW [phone]

## 2012-05-16 NOTE — Progress Notes (Signed)
CARE MANAGEMENT NOTE 05/16/2012  Patient:  Jessica Richardson, Jessica Richardson   Account Number:  [id number]  Date Initiated:  05/15/2012  Documentation initiated by:  Vance Peper  Subjective/Objective Assessment:   42 yr old female admitted with right tib/fib fracture. Will go to surgery today.     Action/Plan:   CM spoke with patient concerning home health needs and DME. Patient has used [facility] and wants to do the same now. States she has rolling walker and 3in1 already.   Anticipated DC Date:  05/18/2012   Anticipated DC Plan:  SKILLED NURSING FACILITY  In-house referral  Clinical Social Worker      DC Planning Services  CM consult      [facility] Choice  HOME HEALTH   Choice offered to / List presented to:  C-1 Patient        HH arranged  HH-2 PT      [facility] agency  [facility]   Status of service:  Completed, signed off Medicare Important Message given?   (If response is "NO", the following Medicare IM given date fields will be blank) Date Medicare IM given:   Date Additional Medicare IM given:    Discharge Disposition:  SKILLED NURSING FACILITY  Per UR Regulation:    If discussed at Long Length of Stay Meetings, dates discussed:    Comments:  05/16/12 2:55 pm Vance Peper, RN BSN Case Manager Physical therapy states patient will need shortterm rehab at [facility] Social Worker Amy Riley Kill is aware.

## 2012-05-16 NOTE — Evaluation (Signed)
Physical Therapy Evaluation Patient Details Name: Jessica Richardson MRN: [id number] DOB: Dec 01, 1970 Today's Date: 05/16/2012 Time: 9562-1308 PT Time Calculation (min): 29 min  PT Assessment / Plan / Recommendation Clinical Impression  Pt is a 42 y/o female s/p IM nail repair of R tib fib fracture.  Pt lives alone and does not have any consistent support at home.  Pt would benefit from short term SNF placement to improve mobility for eventual return to home.       PT Assessment  Patient needs continued PT services    Follow Up Recommendations  SNF;Supervision/Assistance - 24 hour    Does the patient have the potential to tolerate intense rehabilitation      Barriers to Discharge Inaccessible home environment;Decreased caregiver support 3 steps to enter home and no caregiver available.     Equipment Recommendations  Rolling walker with 5" wheels    Recommendations for Other Services     Frequency Min 5X/week    Precautions / Restrictions Precautions Required Braces or Orthoses: Knee Immobilizer - Right Knee Immobilizer - Right: On at all times Restrictions Weight Bearing Restrictions: Yes RLE Weight Bearing: Non weight bearing Other Position/Activity Restrictions: 2 months NWB per MD orders   Pertinent Vitals/Pain 8/10 pain in lower leg.  RN notified.  Applied ice and elevated RLE at end of session for pain relief.        Mobility  Bed Mobility Bed Mobility: Sit to Supine Supine to Sit: Not tested (comment) Sitting - Scoot to Edge of Bed: Not tested (comment) Sit to Supine: 3: Mod assist;HOB flat;With rail Scooting to [facility]: 3: Mod assist;With trapeze Details for Bed Mobility Assistance: Pt required assistance to manage RLE secondary to pain and weakness.  Verbal cues for safe energy saving technique.    Transfers Transfers: Sit to Stand;Stand to Sit;Stand Pivot Transfers Sit to Stand: 3: Mod assist;From chair/3-in-1;With upper extremity assist Stand to Sit: 4: Min  assist;To bed;With upper extremity assist Stand Pivot Transfers: 4: Min assist Details for Transfer Assistance: VCs for hand placement, NWB on RLE and safe technique.  Assist to manage RW and steady pt.   Ambulation/Gait Ambulation/Gait Assistance: Not tested (comment) (attempted but pt unable. ) Wheelchair Mobility Wheelchair Mobility: No    Exercises     PT Diagnosis: Difficulty walking;Generalized weakness;Acute pain  PT Problem List: Decreased strength;Decreased activity tolerance;Decreased mobility;Obesity;Pain;Decreased range of motion;Decreased knowledge of use of DME PT Treatment Interventions: Gait training;Functional mobility training;DME instruction;Therapeutic activities;Therapeutic exercise;Patient/family education   PT Goals Acute Rehab PT Goals PT Goal Formulation: With patient Time For Goal Achievement: 05/30/12 Potential to Achieve Goals: Good Pt will go Supine/Side to Sit: with modified independence;with HOB 0 degrees PT Goal: Supine/Side to Sit - Progress: Goal set today Pt will go Sit to Supine/Side: with modified independence;with HOB 0 degrees PT Goal: Sit to Supine/Side - Progress: Goal set today Pt will Transfer Bed to Chair/Chair to Bed: with modified independence PT Transfer Goal: Bed to Chair/Chair to Bed - Progress: Goal set today Pt will Propel Wheelchair: > 150 feet;with modified independence PT Goal: Propel Wheelchair - Progress: Goal set today  Visit Information  Last PT Received On: 05/16/12 Assistance Needed: +2    Subjective Data  Subjective: Agree to PT eval.     Prior Functioning  Home Living Lives With: Alone Type of Home: House Home Access: Stairs to enter [employer] of Steps: 3 STE Entrance Stairs-Rails: None Home Layout: One level Bathroom Shower/Tub: Radio producer Accessibility:  Yes How Accessible: Accessible via wheelchair;Accessible via walker Home Adaptive Equipment:  Walker - rolling Prior Function Level of Independence: Independent Able to Take Stairs?: Yes Driving: Yes Vocation: Other (comment) (Pt states that she does not work) Musician: No difficulties Dominant Hand: Right    Cognition  Cognition Overall Cognitive Status: Appears within functional limits for tasks assessed/performed Arousal/Alertness: Awake/alert Orientation Level: Appears intact for tasks assessed Behavior During Session: [facility] for tasks performed    Extremity/Trunk Assessment Right Upper Extremity Assessment RUE ROM/Strength/Tone: Within functional levels RUE Sensation: WFL - Light Touch RUE Coordination: WFL - gross/fine motor Left Upper Extremity Assessment LUE ROM/Strength/Tone: Within functional levels LUE Sensation: WFL - Light Touch LUE Coordination: WFL - gross/fine motor Right Lower Extremity Assessment RLE ROM/Strength/Tone: Unable to fully assess;Due to pain;Due to precautions Left Lower Extremity Assessment LLE ROM/Strength/Tone: WFL for tasks assessed   Balance Balance Balance Assessed: No Static Sitting Balance Static Sitting - Balance Support: Bilateral upper extremity supported (LLE supported, RLE NWB, VC's and occasional assist support  ) Static Sitting - Level of Assistance: 5: Stand by assistance  End of Session PT - End of Session Equipment Utilized During Treatment: Gait belt;Right knee immobilizer Activity Tolerance: Patient limited by pain Patient left: in bed;with call bell/phone within reach Nurse Communication: Mobility status  GP     Khushi Zupko 05/16/2012, 2:39 PM Darean Rote L. Arayah Krouse DPT [phone]

## 2012-05-17 LAB — GLUCOSE, CAPILLARY: Glucose-Capillary: 148 mg/dL — ABNORMAL HIGH (ref 70–99)

## 2012-05-17 MED ORDER — SODIUM CHLORIDE 0.9 % IJ SOLN
10.0000 mL | INTRAMUSCULAR | Status: DC | PRN
  Administered 2012-05-17: 10 mL
  Administered 2012-05-17: 20 mL
  Administered 2012-05-17: 10 mL
  Administered 2012-05-18 – 2012-05-19 (×2): 20 mL

## 2012-05-17 MED ORDER — SODIUM CHLORIDE 0.9 % IJ SOLN
10.0000 mL | Freq: Two times a day (BID) | INTRAMUSCULAR | Status: DC
  Administered 2012-05-18: 10 mL

## 2012-05-17 NOTE — Progress Notes (Signed)
CARE MANAGEMENT NOTE 05/17/2012  Patient:  Jessica Richardson, Jessica Richardson   Account Number:  [id number]  Date Initiated:  05/15/2012  Documentation initiated by:  Vance Peper  Subjective/Objective Assessment:   42 yr old female admitted with right tib/fib fracture. Will go to surgery today.     Action/Plan:   CM spoke with patient concerning home health needs and DME. Patient has used [facility] and wants to do the same now. States she has rolling walker and 3in1 already.   Anticipated DC Date:  05/18/2012   Anticipated DC Plan:  HOME W HOME HEALTH SERVICES  In-house referral  Clinical Social Worker      DC Planning Services  CM consult      [facility] Choice  HOME HEALTH   Choice offered to / List presented to:  C-1 Patient        HH arranged  HH-2 PT      [facility] agency  [facility]   Status of service:  Completed, signed off Medicare Important Message given?   (If response is "NO", the following Medicare IM given date fields will be blank) Date Medicare IM given:   Date Additional Medicare IM given:    Discharge Disposition:  HOME W HOME HEALTH SERVICES  Per UR Regulation:    If discussed at Long Length of Stay Meetings, dates discussed:    Comments:   05/17/12 3:00 Vance Peper, RN BSN Case Manager Patient is not a candidate for CIR, and per Social worker she doesnt have snf coverage with medicaid. She will have to go home with home health. CM spoke with patient earlier today concerning this.

## 2012-05-17 NOTE — Progress Notes (Signed)
OT Cancellation Note  Patient Details Name: Jessica Richardson MRN: [id number] DOB: Mar 02, 1971   Cancelled Treatment:     Pt declined OT tx this afternoon stating that she has 8-9/10 pain, feels sleepy and sick on her stomach. Will re attempt tx tomorrow  Galen Manila, OT 05/17/2012, 3:55 PM

## 2012-05-17 NOTE — Progress Notes (Signed)
Patient ID: Jessica Richardson, female   DOB: 03/05/1971, 42 y.o.   MRN: [id number]     Subjective:  Patient reports pain as mild to moderate.  She states that she hs been able to get to the bathroom with use of the walker and able to keep weight off.  Objective:   VITALS:   Filed Vitals:   05/16/12 0612 05/16/12 0800 05/16/12 1400 05/16/12 2336  BP: 140/82 112/72 128/70 130/75  Pulse: 85 88 93 95  Temp: 98.6 F (37 C) 97.5 F (36.4 C) 98.7 F (37.1 C) 97.9 F (36.6 C)  TempSrc:  Oral    Resp: 18 20 16 18   Height:      Weight:      SpO2: 99% 100%  100%    ABD soft Sensation intact distally Dorsiflexion/Plantar flexion intact Incision: dressing C/D/I and no drainage   Lab Results  Component Value Date   WBC 7.1 05/15/2012   HGB 10.4* 05/15/2012   HCT 31.3* 05/15/2012   MCV 89.9 05/15/2012   PLT 221 05/15/2012     Assessment/Plan: 2 Days Post-Op   Principal Problem:   Closed fracture of right fibula and tibia Active Problems:   Hip arthritis, left, post-SCFE, total hip Jan 2013 by Dr Dion Saucier   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   Hypovolemia   Advance diet Up with therapy Plan for discharge tomorrow NWB right lower ext. Okay to have knee immobilizer open while in bed but should have it on at all other times. Patient will likley need SNF placement. Patient requests inpatient rehab if possible.   Haskel Khan 05/17/2012, 7:42 AM   Teryl Lucy, MD Cell [phone] Pager [phone]

## 2012-05-17 NOTE — Progress Notes (Signed)
Rehab Admissions Coordinator Note:  Patient was screened by Clois Dupes for appropriateness for an Inpatient Acute Rehab Consult.  Patient not in need for in hospital rehab admission at this time. Does not have a medical need for hospital rehab.At this time, we are recommending Skilled Nursing Facility. I have discussed with RN CM.  Clois Dupes 05/17/2012, 8:20 AM  I can be reached at [phone].

## 2012-05-17 NOTE — Progress Notes (Signed)
SW contacted MD and PA to notify them that patient does not have benefits to pay for SNF placement. SW will speak with patient about alternative options for discharge disposition.  Sabino Niemann, MSW [phone]

## 2012-05-18 LAB — GLUCOSE, CAPILLARY: Glucose-Capillary: 152 mg/dL — ABNORMAL HIGH (ref 70–99)

## 2012-05-18 MED ORDER — WHITE PETROLATUM GEL
Status: AC
  Administered 2012-05-18: 16:00:00
  Filled 2012-05-18: qty 5

## 2012-05-18 NOTE — Progress Notes (Signed)
Occupational Therapy Treatment Patient Details Name: Jessica Richardson MRN: [id number] DOB: 11-Mar-1971 Today's Date: 05/18/2012 Time: 5284-1324 OT Time Calculation (min): 28 min  OT Assessment / Plan / Recommendation Comments on Treatment Session Pt making excellent progress, feel she is at a modified independent level for all selfcare tasks and toileting.  She has all OT related DME and AE.  No further acute or post acute OT needs.    Follow Up Recommendations  No OT follow up       Equipment Recommendations  None recommended by OT       Frequency Min 3X/week   Plan Discharge plan remains appropriate    Precautions / Restrictions Precautions Precautions: None Required Braces or Orthoses: Knee Immobilizer - Right Knee Immobilizer - Right: On at all times Restrictions Weight Bearing Restrictions: Yes RLE Weight Bearing: Non weight bearing   Pertinent Vitals/Pain No report of pain during session    ADL  Lower Body Dressing: Performed;Modified independent Where Assessed - Lower Body Dressing: Unsupported sitting Toilet Transfer: Performed;Modified independent Toilet Transfer Method: Surveyor, minerals: Materials engineer and Hygiene: Simulated;Modified independent Where Assessed - Toileting Clothing Manipulation and Hygiene: Sit to stand from 3-in-1 or toilet Transfers/Ambulation Related to ADLs: Pt is modified independent for short distance moiblity using the RW and maintaining her NWBing status. ADL Comments: Pt making excellent progress with OT to a modified independent level for selfcare tasks and toilet transfers.  Pt will have some assistance from her mother at discharge with meal setup and homemanagement.  Pt has all needed DME and AE.  She is able to independently donn her KI as well.  No further OT needs.      OT Goals Acute Rehab OT Goals OT Goal Formulation: With patient Time For Goal Achievement:  05/18/12 Potential to Achieve Goals: Good ADL Goals Pt Will Perform Lower Body Dressing: with modified independence;Sit to stand from bed ADL Goal: Lower Body Dressing - Progress: Goal set today Pt Will Transfer to Toilet: with modified independence;with DME;3-in-1;Maintaining weight bearing status ADL Goal: Toilet Transfer - Progress: Goal set today Pt Will Perform Toileting - Clothing Manipulation: with modified independence ADL Goal: Toileting - Clothing Manipulation - Progress: Goal set today Pt Will Perform Toileting - Hygiene: with modified independence;Sit to stand from 3-in-1/toilet ADL Goal: Toileting - Hygiene - Progress: Goal set today  Visit Information  Last OT Received On: 05/18/12 Assistance Needed: +1    Subjective Data  Subjective: Pt reports having AE from her previous hip surgery. Patient Stated Goal: Did not state but is agreeable to participate in OT      Cognition  Cognition Overall Cognitive Status: Appears within functional limits for tasks assessed/performed Arousal/Alertness: Awake/alert Orientation Level: Appears intact for tasks assessed Behavior During Session: [facility] for tasks performed    Mobility  Bed Mobility Bed Mobility: Supine to Sit;Sit to Supine Supine to Sit: 6: Modified independent (Device/Increase time);HOB flat Sitting - Scoot to Edge of Bed: 6: Modified independent (Device/Increase time) Sit to Supine: 6: Modified independent (Device/Increase time);HOB flat Scooting to HOB: 6: Modified independent (Device/Increase time) Transfers Transfers: Sit to Stand Sit to Stand: 6: Modified independent (Device/Increase time);Without upper extremity assist;From chair/3-in-1 Stand to Sit: 6: Modified independent (Device/Increase time);To chair/3-in-1       Balance Balance Balance Assessed: Yes Dynamic Standing Balance Dynamic Standing - Balance Support: Right upper extremity supported;Left upper extremity supported Dynamic Standing - Level of  Assistance: 5: Stand by assistance  End of Session OT - End of Session Equipment Utilized During Treatment: Right knee immobilizer Activity Tolerance: Patient tolerated treatment well Patient left: in bed Nurse Communication: Mobility status     Klever Twyford OTR/L Pager number [phone] 05/18/2012, 5:41 PM

## 2012-05-18 NOTE — Progress Notes (Signed)
     Subjective:  Patient reports pain as moderate.  She is requiring IV and by mouth pain medication. She tried working with stairs today, and had fairly significant difficulty. She apparently has been denied for skilled nursing placement due to inadequate Medicaid coverage. She'll need to be independent and safe prior to getting home.  Objective:   VITALS:   Filed Vitals:   05/16/12 2336 05/17/12 1400 05/17/12 2004 05/18/12 0634  BP: 130/75 140/68 132/69 128/70  Pulse: 95 101 83 88  Temp: 97.9 F (36.6 C) 98.1 F (36.7 C) 98.5 F (36.9 C) 98.7 F (37.1 C)  TempSrc:   Oral Oral  Resp: 18 18 18 16   Height:      Weight:      SpO2: 100% 100% 97% 98%    Neurologically intact Dorsiflexion/Plantar flexion intact Compartment soft   Lab Results  Component Value Date   WBC 7.1 05/15/2012   HGB 10.4* 05/15/2012   HCT 31.3* 05/15/2012   MCV 89.9 05/15/2012   PLT 221 05/15/2012     Assessment/Plan: 3 Days Post-Op   Principal Problem:   Closed fracture of right fibula and tibia Active Problems:   Hip arthritis, left, post-SCFE, 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 Nonweightbearing, right lower extremity.   Cruze Zingaro P 05/18/2012, 1:01 PM   Jessica Lucy, MD Cell [phone] Pager [phone]

## 2012-05-18 NOTE — Progress Notes (Signed)
Patient does not have SNF benefits and cannot privately pay at this time. Patient is set up with home health and SW met with patient to give her the Medicaid transport phone number for MD appointments and all other needs. Patient reported that she has some help at home with from her mother and some help for her two children.  Social Worker will sign off for now as social work intervention is no longer needed. Please consult [place] again if new need arises.    Sabino Niemann, MSW, [employer] [phone]

## 2012-05-18 NOTE — Progress Notes (Signed)
Physical Therapy Treatment Patient Details Name: Jessica Richardson MRN: [id number] DOB: 1971-02-21 Today's Date: 05/18/2012 Time: 0865-7846 PT Time Calculation (min): 20 min  PT Assessment / Plan / Recommendation Comments on Treatment Session  Pt demonstrating safety and modified independence with mobility.  Pt should be able to return to home. Pt will benefit from a few HHPT visists to educate pt in use of crutches as she reports some rooms may not accomodate RWs width.      Follow Up Recommendations  Home health PT     Does the patient have the potential to tolerate intense rehabilitation     Barriers to Discharge        Equipment Recommendations  Rolling walker with 5" wheels;Other (comment)    Recommendations for Other Services    Frequency Min 5X/week   Plan Frequency remains appropriate;Discharge plan needs to be updated    Precautions / Restrictions Precautions Required Braces or Orthoses: Knee Immobilizer - Right Knee Immobilizer - Right: On at all times Restrictions Weight Bearing Restrictions: Yes RLE Weight Bearing: Non weight bearing Other Position/Activity Restrictions: 2 months NWB per MD orders   Pertinent Vitals/Pain 4/10 pain in R leg. Pt medicated 45 minutes prior to session.     Mobility  Bed Mobility Bed Mobility: Supine to Sit;Sitting - Scoot to Edge of Bed Supine to Sit: 6: Modified independent (Device/Increase time) Sitting - Scoot to Edge of Bed: 6: Modified independent (Device/Increase time) Transfers Transfers: Sit to Stand;Stand to [employer] Transfers Sit to Stand: 6: Modified independent (Device/Increase time);From bed;From chair/3-in-1;With upper extremity assist Stand to Sit: 6: Modified independent (Device/Increase time);To chair/3-in-1;With upper extremity assist Stand Pivot Transfers: 6: Modified independent (Device/Increase time) Details for Transfer Assistance: No assistance or instruction required.    Ambulation/Gait Ambulation/Gait Assistance: 5: Supervision Ambulation Distance (Feet): 10 Feet Assistive device: Rolling walker Gait Pattern: Step-to pattern Stairs: Yes Stairs Assistance: 4: Min assist Stairs Assistance Details (indicate cue type and reason): Demonstrated proper technique as well as provided verbal cueing to pt.  Pt able to return demonstrate maintaining NWB on RLE.   Stair Management Technique: With walker;No rails;Backwards Number of Stairs: 2 Wheelchair Mobility Wheelchair Mobility: No    Exercises Total Joint Exercises Ankle Circles/Pumps: 5 reps;Both Quad Sets: Both;5 reps Hip ABduction/ADduction: Right;5 reps;AAROM Straight Leg Raises: 10 reps;AAROM;Right   PT Diagnosis:    PT Problem List:   PT Treatment Interventions:     PT Goals Acute Rehab PT Goals PT Goal Formulation: With patient Time For Goal Achievement: 05/30/12 Potential to Achieve Goals: Good Pt will go Supine/Side to Sit: with modified independence;with HOB 0 degrees PT Goal: Supine/Side to Sit - Progress: Met Pt will go Sit to Supine/Side: with modified independence;with HOB 0 degrees PT Goal: Sit to Supine/Side - Progress: Met Pt will Transfer Bed to Chair/Chair to Bed: with modified independence PT Transfer Goal: Bed to Chair/Chair to Bed - Progress: Met Pt will Ambulate: 1 - 15 feet;with supervision;with rolling walker PT Goal: Ambulate - Progress: Met Pt will Go Up / Down Stairs: 1-2 stairs;with mod assist;with least restrictive assistive device PT Goal: Up/Down Stairs - Progress: Met  Visit Information  Last PT Received On: 05/18/12 Assistance Needed: +2    Subjective Data      Cognition  Cognition Overall Cognitive Status: Appears within functional limits for tasks assessed/performed Arousal/Alertness: Awake/alert Orientation Level: Appears intact for tasks assessed Behavior During Session: [facility] for tasks performed    Balance  Balance Balance Assessed: No  End  of  Session PT - End of Session Equipment Utilized During Treatment: Gait belt;Right knee immobilizer Activity Tolerance: Patient tolerated treatment well Patient left: in chair;with call bell/phone within reach Nurse Communication: Mobility status   GP     Gorgeous Newlun 05/18/2012, 12:44 PM Coren Crownover L. Lovelee Forner DPT [phone]

## 2012-05-19 LAB — GLUCOSE, CAPILLARY
Glucose-Capillary: 117 mg/dL — ABNORMAL HIGH (ref 70–99)
Glucose-Capillary: 122 mg/dL — ABNORMAL HIGH (ref 70–99)
Glucose-Capillary: 116 mg/dL — ABNORMAL HIGH (ref 70–99)

## 2012-05-19 MED ORDER — WHITE PETROLATUM GEL
Status: AC
  Administered 2012-05-19: 13:00:00
  Filled 2012-05-19: qty 5

## 2012-05-19 NOTE — Progress Notes (Signed)
     Subjective:  Patient reports pain as moderate.  Still working with physical therapy, in order to improve mobility.  Objective:   VITALS:   Filed Vitals:   05/18/12 0634 05/18/12 1502 05/18/12 2011 05/19/12 0622  BP: 128/70 129/60 111/67 122/66  Pulse: 88 95 79 88  Temp: 98.7 F (37.1 C) 98.1 F (36.7 C) 98.5 F (36.9 C) 98.7 F (37.1 C)  TempSrc: Oral  Oral Oral  Resp: 16 18 18 16   Height:      Weight:      SpO2: 98% 96% 95% 97%    Neurologically intact Sensation intact distally Dorsiflexion/Plantar flexion intact   Lab Results  Component Value Date   WBC 7.1 05/15/2012   HGB 10.4* 05/15/2012   HCT 31.3* 05/15/2012   MCV 89.9 05/15/2012   PLT 221 05/15/2012     Assessment/Plan: 4 Days Post-Op   Principal Problem:   Closed fracture of right fibula and tibia Active Problems:   Hip arthritis, left, post-SCFE, 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

## 2012-05-19 NOTE — Progress Notes (Signed)
[facility]  Patient Status: New  [facility] is providing the following services: PT - noted pt is going home, not to a SNF, possibly tomorrow. Is on the schedule for a PT visit at home on Sunday or Monday.  If patient discharges after hours, please call [phone].   [facility] 05/19/2012, 11:42 AM

## 2012-05-20 LAB — GLUCOSE, CAPILLARY
Glucose-Capillary: 183 mg/dL — ABNORMAL HIGH (ref 70–99)
Glucose-Capillary: 150 mg/dL — ABNORMAL HIGH (ref 70–99)

## 2012-05-20 NOTE — Progress Notes (Signed)
Physical Therapy Treatment Patient Details Name: Jessica Richardson MRN: [id number] DOB: 12/22/1970 Today's Date: 05/20/2012 Time: 1354-1410 PT Time Calculation (min): 16 min  PT Assessment / Plan / Recommendation Comments on Treatment Session  Pt demonstrating safety and modified independence with mobility with previous PT sessions. Educated pt on ther ex for strengthening today.  Verbally reviewed stair technique with walker and pt stated she felt comfortable with this technique as that is how she went up/down stairs after her hip replacement last year.    Follow Up Recommendations  Home health PT           Equipment Recommendations  Rolling walker with 5" wheels       Frequency Min 5X/week   Plan Discharge plan remains appropriate;Frequency remains appropriate    Precautions / Restrictions Precautions Precautions: Fall Required Braces or Orthoses: Knee Immobilizer - Right Knee Immobilizer - Right: On at all times Restrictions RLE Weight Bearing: Non weight bearing    Exercises Total Joint Exercises Ankle Circles/Pumps: AROM;Both;10 reps;Supine Quad Sets: AROM;Both;10 reps;Supine;Strengthening Hip ABduction/ADduction: AAROM;Strengthening;Right;10 reps;Supine Straight Leg Raises: AAROM;Strengthening;Right;10 reps;Supine     Visit Information  Last PT Received On: 05/20/12 Assistance Needed: +1    Subjective Data  Subjective: No new complaints, just returned to bed so only willing to do exercises at this time.   Cognition  Cognition Overall Cognitive Status: Appears within functional limits for tasks assessed/performed Arousal/Alertness: Awake/alert Orientation Level: Appears intact for tasks assessed Behavior During Session: [facility] for tasks performed       End of Session PT - End of Session Equipment Utilized During Treatment: Right knee immobilizer Activity Tolerance: Patient tolerated treatment well Patient left: in bed;with call bell/phone within reach Nurse  Communication: Mobility status   GP     Sallyanne Kuster 05/20/2012, 2:16 PM   Sallyanne Kuster, PTA Office- [phone]

## 2012-05-20 NOTE — Progress Notes (Signed)
D/C instructions reviewed with patient and friend. RX x 2 given. hh services arranged with [facility]. hh equipment at home. All questions answered. Pt d/c'ed via wheelchair in stable condition

## 2012-05-20 NOTE — Discharge Summary (Signed)
PATIENT ID: Jessica Richardson        MRN:  [id number]          DOB/AGE: 05-25-1970 / 42 y.o.    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 Arthritis   . Diabetes mellitus     diet conttrolled  only,no meds ever  . Hip arthritis, left, post-SCFE 03/29/2011  . Depression     PROCEDURE: Procedure(s): Open Reduction Internal Fixation Right Tibia Righton 05/13/2012 - 05/15/2012  CONSULTS: none     HISTORY:  See H&P in chart  HOSPITAL COURSE:  Jessica Richardson is a 42 y.o. admitted on 05/13/2012 and found to have a diagnosis of right tib/fib fracture closed.  She was admitted for monitoring of compartments and pain control with planned surgery for 05/15/12.  After appropriate laboratory studies were obtained  they were taken to the operating room on  05/15/2012 and underwent  Procedure(s): Open Reduction Internal Fixation Right Tibia Right.   They were given perioperative antibiotics:  Anti-infectives   Start     Dose/Rate Route Frequency Ordered Stop   05/15/12 2300  ceFAZolin (ANCEF) 3 g in dextrose 5 % 50 mL IVPB     3 g 160 mL/hr over 30 Minutes Intravenous Every 6 hours 05/15/12 2042 05/16/12 1046   05/15/12 1600  vancomycin (VANCOCIN) 1,500 mg in sodium chloride 0.9 % 500 mL IVPB  Status:  Discontinued     1,500 mg 250 mL/hr over 120 Minutes Intravenous  Once 05/15/12 1548 05/15/12 2042   05/15/12 1545  vancomycin (VANCOCIN) IVPB 1000 mg/200 mL premix  Status:  Discontinued     1,000 mg 200 mL/hr over 60 Minutes Intravenous  Once 05/15/12 1540  05/15/12 1547   05/15/12 1539  ceFAZolin (ANCEF) 3 g in dextrose 5 % 50 mL IVPB  Status:  Discontinued     3 g 160 mL/hr over 30 Minutes Intravenous 30 min pre-op 05/15/12 1540 05/15/12 1548   05/15/12 0600  vancomycin (VANCOCIN) 1,500 mg in sodium chloride 0.9 % 500 mL IVPB     1,500 mg 250 mL/hr over 120 Minutes Intravenous On call to O.R. 05/14/12 0626 05/15/12 1555   05/14/12 0600  vancomycin (VANCOCIN) 1,500 mg in sodium chloride 0.9 % 500 mL IVPB  Status:  Discontinued     1,500 mg 250 mL/hr over 120 Minutes Intravenous On call to O.R. 05/13/12 1621 05/14/12 4098    .  Tolerated the procedure well.  Placed with a foley intraoperatively.    POD #1, allowed out of bed to a chair with assist, NWB RLE in knee immobilizer.  PT for ambulation and exercise program.  Foley D/C'd in morning.  IV saline locked.  O2 discontionued.  POD #2, continued PT and ambulation. Pain control,   .  The remainder of the hospital course was dedicated to ambulation and strengthening.  Patient denied SNF placement due to insurance so continued work on mobility for d/c home with possible HH. The patient was discharged on 5 Days Post-Op in  Stable condition.  Blood products given:none  DIAGNOSTIC STUDIES: Recent vital signs: Patient Vitals for the past 24 hrs:  BP Temp Temp src Pulse Resp SpO2  05/20/12 0512 105/69 mmHg 98.1 F (36.7 C) Oral 64 18 100 %  05/19/12 2016 127/60 mmHg 98.6 F (37 C) Oral 66 18 99 %  05/19/12 1412 150/86 mmHg 98.2 F (36.8 C) - 95 18 97 %  05/19/12 1408 - 98.2 F (36.8 C) - 95 - -       Recent laboratory studies:  Recent Labs  05/13/12 1710 05/14/12 0500 05/15/12 2125  WBC 5.0 4.5 7.1  HGB 10.2* 10.1* 10.4*  HCT 30.7* 30.6* 31.3*  PLT 252 239 221    Recent Labs  05/13/12 1710 05/15/12 2125  NA 138  --   K 3.7  --   CL 104  --   CO2 27  --   BUN 13  --   CREATININE 0.78 0.63  GLUCOSE 137*  --   CALCIUM 8.5  --    Lab Results  Component Value Date    INR 1.13 05/13/2012   INR 1.63* 04/02/2011   INR 1.58* 04/01/2011     Recent Radiographic Studies :  Dg Tibia/fibula Right  05/15/2012  *RADIOLOGY REPORT*  Clinical Data: Right tibial fracture, ORIF  RIGHT TIBIA AND FIBULA TWO VIEWS:  Comparison: 05/13/2012  Findings: Five digital C-arm fluoroscopic images obtained intraoperatively are reviewed. Images demonstrate placement of a lateral buttress plate and multiple screws across a proximal right tibial metaphyseal fracture. Joint spaces preserved. Proximal fibular fracture is again noted.  IMPRESSION: Post ORIF of a proximal right tibial metaphyseal fracture.   Original Report Authenticated By: Ulyses Southward, M.D.    Dg Tibia/fibula Right  05/13/2012  *RADIOLOGY REPORT*  Clinical Data: Right lower leg pain from injury last night.  The patient fell.  RIGHT TIBIA AND FIBULA - 2 VIEW  Comparison: None.  Findings: There is a transverse fracture through the proximal right tibial metaphysis without significant displacement.  There is also a transverse fracture through the proximal fibula without significant displacement.  Fracture lines do not appear to extend into the knee joint.  The distal tibia and fibula appear intact. Diffuse soft tissue calcifications consistent with dermatomyositis or calcification within sebaceous glands.  No focal bone lesion or bone destruction.  IMPRESSION: Mostly transverse fractures through the proximal metaphyseal regions of the right tibia and fibula.   Original Report Authenticated By: Burman Nieves, M.D.    Ct Knee Right Wo Contrast  05/13/2012  *RADIOLOGY REPORT*  Clinical Data: Proximal tibia/fibular fractures.  CT OF THE RIGHT KNEE WITHOUT CONTRAST  Technique:  Multidetector CT imaging was performed according to the standard protocol. Multiplanar CT image reconstructions were also generated.  Comparison: Radiographs 05/13/2012.  Findings: There are complex comminuted fractures involving the proximal tibial and fibular shafts.   No significant displacement. There are small vertical fractures posteriorly, just lateral to the lateral tibial spine posteriorly, which do extend to the articular surface of the lateral tibial plateau but there is no displacement or depression.  There are tricompartmental degenerative changes involving the knee the with joint space narrowing and osteophytic spurring. There is also chondrocalcinosis.  A moderate sized joint effusion/hemarthrosis is noted.  IMPRESSION:  1.  Comminuted fractures of the proximal tibia and fibular shafts without significant displacement. 2.  There are small vertical fractures extending to the lateral tibial plateau but no displacement or depression. 3.  Moderate sized joint effusion. 4.  Tricompartmental degenerative changes at  the knee joint.   Original Report Authenticated By: Rudie Meyer, M.D.    Dg Chest Portable 1 View  05/13/2012  *RADIOLOGY REPORT*  Clinical Data: Preoperative evaluation for knee surgery.  PORTABLE CHEST - 1 VIEW  Comparison: Earlier same day  Findings: Right internal jugular central line has its tip at the SVC/RA junction.  There is a poor inspiration.  Allowing for that the lungs are clear.  No effusions.  No bony abnormalities.  IMPRESSION: Poor inspiration.  No active disease suspected.  Central line at the SVC/RA junction.   Original Report Authenticated By: Paulina Fusi, M.D.    Dg Chest Port 1 View  05/13/2012  *RADIOLOGY REPORT*  Clinical Data: Right-sided central line placement.  PORTABLE CHEST - 1 VIEW  Comparison: 03/24/2011  Findings: Right internal jugular line terminates at the mid to low SVC, without pneumothorax.  Midline trachea.  Normal heart size and mediastinal contours. No pleural fluid.  Clear lungs.  IMPRESSION: No acute cardiopulmonary disease.  Right internal jugular line appropriately positioned, without pneumothorax.   Original Report Authenticated By: Jeronimo Greaves, M.D.    Dg Knee Right Port  05/15/2012  *RADIOLOGY REPORT*   Clinical Data: ORIF right tibia  PORTABLE RIGHT KNEE - 1-2 VIEW  Comparison: Portable exam 1922 hours compared to intraoperative images of 05/15/2012  Findings: Lateral plate and multiple screws identified across a fracture of the proximal right tibial metaphysis. Knee joint alignment appears normal. Patellofemoral spur formation. Associated soft tissue swelling. Again identified nondisplaced proximal fibular fracture. Visualized portion of the femur appears intact. Small knee joint effusion is present.  IMPRESSION: Post ORIF of the proximal right tibia.   Original Report Authenticated By: Ulyses Southward, M.D.    Dg C-arm [phone] Min  05/15/2012  *RADIOLOGY REPORT*  Clinical Data: Right tibial fracture, ORIF  RIGHT TIBIA AND FIBULA TWO VIEWS:  Comparison: 05/13/2012  Findings: Five digital C-arm fluoroscopic images obtained intraoperatively are reviewed. Images demonstrate placement of a lateral buttress plate and multiple screws across a proximal right tibial metaphyseal fracture. Joint spaces preserved. Proximal fibular fracture is again noted.  IMPRESSION: Post ORIF of a proximal right tibial metaphyseal fracture.   Original Report Authenticated By: Ulyses Southward, M.D.     DISCHARGE INSTRUCTIONS: Discharge Orders   Future Orders Complete By Expires     Call MD / Call 911  As directed     Comments:      If you experience chest pain or shortness of breath, CALL 911 and be transported to the hospital emergency room.  If you develope a fever above 101 F, pus (white drainage) or increased drainage or redness at the wound, or calf pain, call your surgeon's office.    Constipation Prevention  As directed     Comments:      Drink plenty of fluids.  Prune juice may be helpful.  You may use a stool softener, such as Colace (over the counter) 100 mg twice a day.  Use MiraLax (over the counter) for constipation as needed.    Diet general  As directed     Discharge instructions  As directed     Comments:      Change  dressing in 3 days and reapply fresh dressing, unless you have a splint (half cast).  If you have a splint/cast, just leave in place until your follow-up appointment.    Keep wounds dry for 3 weeks.  Leave steri-strips in place on skin.  Do not apply lotion or anything to  the wound.    Non weight bearing  As directed        DISCHARGE MEDICATIONS:     Medication List    TAKE these medications       buPROPion 150 MG 12 hr tablet  Commonly known as:  WELLBUTRIN SR  Take 150 mg by mouth 2 (two) times daily.     enoxaparin 40 MG/0.4ML injection  Commonly known as:  LOVENOX  Inject 0.4 mLs (40 mg total) into the skin daily.     methocarbamol 500 MG tablet  Commonly known as:  ROBAXIN  Take 1 tablet (500 mg total) by mouth 4 (four) times daily.     oxyCODONE-acetaminophen 10-325 MG per tablet  Commonly known as:  PERCOCET  Take 1-2 tablets by mouth every 6 (six) hours as needed for pain. MAXIMUM TOTAL ACETAMINOPHEN DOSE IS 4000 MG PER DAY     sennosides-docusate sodium 8.6-50 MG tablet  Commonly known as:  SENOKOT-S  Take 1 tablet by mouth daily.        FOLLOW UP VISIT:       Follow-up Information   Follow up with Eulas Post, MD. Schedule an appointment as soon as possible for a visit in 2 weeks.   Contact information:   [address] [place] [address] [phone]       DISPOSITION:   Home  CONDITION:  Stable   Margart Sickles 05/20/2012, 10:40 AM

## 2012-05-20 NOTE — Progress Notes (Signed)
Subjective: 5 Days Post-Op Procedure(s) (LRB): Open Reduction Internal Fixation Right Tibia (Right) Patient reports pain as mild and moderate.    Objective: Vital signs in last 24 hours: Temp:  [98.1 F (36.7 C)-98.6 F (37 C)] 98.1 F (36.7 C) (03/01 0512) Pulse Rate:  [64-95] 64 (03/01 0512) Resp:  [18] 18 (03/01 0512) BP: (105-150)/(60-86) 105/69 mmHg (03/01 0512) SpO2:  [97 %-100 %] 100 % (03/01 0512)  Intake/Output from previous day: 02/28 0701 - 03/01 0700 In: 240 [P.O.:240] Out: -  Intake/Output this shift:    No results found for this basename: HGB,  in the last 72 hours No results found for this basename: WBC, RBC, HCT, PLT,  in the last 72 hours No results found for this basename: NA, K, CL, CO2, BUN, CREATININE, GLUCOSE, CALCIUM,  in the last 72 hours No results found for this basename: LABPT, INR,  in the last 72 hours  Neurovascular intact Sensation intact distally Intact pulses distally Dorsiflexion/Plantar flexion intact Incision: dressing C/D/I No cellulitis present Compartment soft  Assessment/Plan: 5 Days Post-Op Procedure(s) (LRB): Open Reduction Internal Fixation Right Tibia (Right) Discharge home with home health NWB RLE in [place]   [place], Jessica Richardson 05/20/2012, 10:38 AM

## 2013-03-05 ENCOUNTER — Emergency Department: Payer: Self-pay | Admitting: Emergency Medicine

## 2013-04-12 ENCOUNTER — Ambulatory Visit: Payer: Self-pay | Admitting: Internal Medicine

## 2014-05-30 ENCOUNTER — Ambulatory Visit: Payer: Self-pay | Admitting: Internal Medicine

## 2016-03-30 ENCOUNTER — Emergency Department: Payer: BLUE CROSS/BLUE SHIELD

## 2016-03-30 ENCOUNTER — Emergency Department
Admission: EM | Admit: 2016-03-30 | Discharge: 2016-03-30 | Disposition: A | Discharge status: Home / Self Care | Payer: BLUE CROSS/BLUE SHIELD | Attending: Emergency Medicine | Admitting: Emergency Medicine

## 2016-03-30 ENCOUNTER — Encounter: Payer: Self-pay | Admitting: *Deleted

## 2016-03-30 DIAGNOSIS — Z79899 Other long term (current) drug therapy: Secondary | ICD-10-CM | POA: Diagnosis not present

## 2016-03-30 DIAGNOSIS — M545 Low back pain, unspecified: Secondary | ICD-10-CM

## 2016-03-30 DIAGNOSIS — R202 Paresthesia of skin: Secondary | ICD-10-CM

## 2016-03-30 DIAGNOSIS — Z87891 Personal history of nicotine dependence: Secondary | ICD-10-CM | POA: Insufficient documentation

## 2016-03-30 DIAGNOSIS — S72002A Fracture of unspecified part of neck of left femur, initial encounter for closed fracture: Secondary | ICD-10-CM | POA: Diagnosis not present

## 2016-03-30 DIAGNOSIS — E119 Type 2 diabetes mellitus without complications: Secondary | ICD-10-CM | POA: Diagnosis not present

## 2016-03-30 MED ORDER — PREDNISONE 20 MG PO TABS
60.0000 mg | ORAL_TABLET | Freq: Once | ORAL | Status: AC
  Administered 2016-03-30: 60 mg via ORAL
  Filled 2016-03-30: qty 3

## 2016-03-30 MED ORDER — METHOCARBAMOL 750 MG PO TABS: 750.0000 mg | ORAL_TABLET | Freq: Four times a day (QID) | ORAL | 0 refills | Status: DC

## 2016-03-30 MED ORDER — TRAMADOL HCL 50 MG PO TABS
50.0000 mg | ORAL_TABLET | Freq: Once | ORAL | Status: AC
  Administered 2016-03-30: 50 mg via ORAL
  Filled 2016-03-30: qty 1

## 2016-03-30 MED ORDER — TRAMADOL HCL 50 MG PO TABS: 50.0000 mg | ORAL_TABLET | Freq: Four times a day (QID) | ORAL | 0 refills | Status: DC | PRN

## 2016-03-30 MED ORDER — METHYLPREDNISOLONE 4 MG PO TBPK: ORAL_TABLET | ORAL | 0 refills | Status: DC

## 2016-03-30 MED ORDER — CYCLOBENZAPRINE HCL 10 MG PO TABS
10.0000 mg | ORAL_TABLET | Freq: Once | ORAL | Status: AC
  Administered 2016-03-30: 10 mg via ORAL
  Filled 2016-03-30: qty 1

## 2016-03-30 NOTE — ED Triage Notes (Signed)
States lower back pain since yesterday and developed left hip pain this AM, pt ambulatory, states hx of hip replacement in same hip

## 2016-03-30 NOTE — ED Notes (Signed)
Says her right lower back started hurting yesterday.  No injury. Also right hip area feels numb over hip. Hx left hip replacement.  No hx back probs.  Nad.

## 2016-03-30 NOTE — ED Provider Notes (Signed)
[facility] Emergency Department Provider Note   ____________________________________________   First MD Initiated Contact with Patient 03/30/16 1037     (approximate)  I have reviewed the triage vital signs and the nursing notes.   HISTORY  Chief Complaint Back Pain and Hip Pain    HPI Jessica Richardson is a 46 y.o. female patient complaining of low back pain which started yesterday and left hip numbness was started this morning. Patient state she can bear weightbut with discomfort. Patient had a total left hip replacement 2013. Patient denies lateral bowel dysfunction. No palliative measures taken for this complaint. Patient stated no prior history of back problems. Patient rates the pain as 8/10. Patient described a pain as "achy". Patient also states his numbness radiating down the left leg.   Past Medical History:  Diagnosis Date  . Arthritis   . Depression   . Diabetes mellitus    diet conttrolled  only,no meds ever  . Hip arthritis, left, post-SCFE 03/29/2011    Patient Active Problem List   Diagnosis Date Noted  . Closed fracture of right fibula and tibia 05/13/2012  . 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 Hypovolemia 05/13/2012  . Hip arthritis, left, post-SCFE, total hip Jan 2013 by Dr Mardelle Matte 03/29/2011    Past Surgical History:  Procedure Laterality Date  . ABDOMINAL HYSTERECTOMY    . APPENDECTOMY    . CESAREAN SECTION    . JOINT REPLACEMENT     pins both hips  . TIBIA IM NAIL INSERTION Right 05/15/2012   Procedure: Open Reduction Internal Fixation Right Tibia;  Surgeon: Johnny Bridge, MD;  Location: [place];  Service: Orthopedics;  Laterality: Right;  . TONSILLECTOMY    . TOTAL HIP ARTHROPLASTY  03/29/2011   Procedure: TOTAL HIP ARTHROPLASTY;  Surgeon: Johnny Bridge;  Location: [place];  Service: Orthopedics;  Laterality: Left;  with removal of retained hardware (knowles pins)    Prior to  Admission medications   Medication Sig Start Date End Date Taking? Authorizing Provider  buPROPion (WELLBUTRIN SR) 150 MG 12 hr tablet Take 150 mg by mouth 2 (two) times daily.   Yes Historical Provider, MD  enoxaparin (LOVENOX) 40 MG/0.4ML injection Inject 0.4 mLs (40 mg total) into the skin daily. 05/15/12   Marchia Bond, MD  methocarbamol (ROBAXIN) 500 MG tablet Take 1 tablet (500 mg total) by mouth 4 (four) times daily. 05/15/12   Marchia Bond, MD  methocarbamol (ROBAXIN-750) 750 MG tablet Take 1 tablet (750 mg total) by mouth 4 (four) times daily. 03/30/16   Sable Feil, PA-C  methylPREDNISolone (MEDROL DOSEPAK) 4 MG TBPK tablet Take Tapered dose as directed 03/30/16   Sable Feil, PA-C  oxyCODONE-acetaminophen (PERCOCET) 10-325 MG per tablet Take 1-2 tablets by mouth every 6 (six) hours as needed for pain. MAXIMUM TOTAL ACETAMINOPHEN DOSE IS 4000 MG PER DAY 05/15/12   Marchia Bond, MD  sennosides-docusate sodium (SENOKOT-S) 8.6-50 MG tablet Take 1 tablet by mouth daily. 05/15/12   Marchia Bond, MD  traMADol (ULTRAM) 50 MG tablet Take 1 tablet (50 mg total) by mouth every 6 (six) hours as needed. 03/30/16 03/30/17  Sable Feil, PA-C    Allergies Penicillins  History reviewed. No pertinent family history.  Social History Social History  Substance Use Topics  . Smoking status: Former Smoker    Quit date: 03/24/2011  . Smokeless tobacco: Not on file  . Alcohol use Yes  Comment: rarely    Review of Systems Constitutional: No fever/chills Eyes: No visual changes. ENT: No sore throat. Cardiovascular: Denies chest pain. Respiratory: Denies shortness of breath. Gastrointestinal: No abdominal pain.  No nausea, no vomiting.  No diarrhea.  No constipation. Genitourinary: Negative for dysuria. Musculoskeletal: Positive for back pain. Skin: Negative for rash. Neurological: Negative for headaches, focal weakness. States numbness left lower  extremity. Psychiatric:Depression Endocrine:Diabetes Hematological/Lymphatic: Allergic/Immunilogical: Penicillin  ____________________________________________   PHYSICAL EXAM:  VITAL SIGNS: ED Triage Vitals   Enc Vitals Group     BP 127/78     Pulse Rate 90     Resp 18     Temp 98.2 F (36.8 C)     Temp Source Oral     SpO2 98 %     Weight (!) 310 lb (140.6 kg)     Height 5\' 4"  (1.626 m)     Head Circumference      Peak Flow      Pain Score 8     Pain Loc      Pain Edu?      Excl. in [place]?     Constitutional: Alert and oriented. Well appearing and in no acute distress. Eyes: Conjunctivae are normal. PERRL. EOMI. Head: Atraumatic. Nose: No congestion/rhinnorhea. Mouth/Throat: Mucous membranes are moist.  Oropharynx non-erythematous. Neck: No stridor. No cervical spine tenderness to palpation. Hematological/Lymphatic/Immunilogical: No cervical lymphadenopathy. Cardiovascular: Normal rate, regular rhythm. Grossly normal heart sounds.  Good peripheral circulation. Respiratory: Normal respiratory effort.  No retractions. Lungs CTAB. Gastrointestinal: Soft and nontender. No distention. No abdominal bruits. No CVA tenderness. Musculoskeletal: No obvious spinal deformity. No leg length discrepancy. Patient moderate guarding palpation L3-L5. Patient has negative straight leg test. Neurologic:  Normal speech and language. No gross focal neurologic deficits are appreciated. No gait instability. Skin:  Skin is warm, dry and intact. No rash noted. Psychiatric: Mood and affect are normal. Speech and behavior are normal.  ____________________________________________   LABS (all labs ordered are listed, but only abnormal results are displayed)  Labs Reviewed - No data to display ____________________________________________  EKG  _______________________________________  RADIOLOGY  No acute findings x-ray of the lumbar spine. Pelvic and right hip x-ray suggestive of  loosening of one of the internal fixations. ____________________________________________   PROCEDURES  Procedure(s) performed: None  Procedures  Critical Care performed: No  ____________________________________________   INITIAL IMPRESSION / ASSESSMENT AND PLAN / ED COURSE  Pertinent labs & imaging results that were available during my care of the patient were reviewed by me and considered in my medical decision making (see chart for details).  Paresthesia left leg. Back pain patient given discharge care instructions. Patient given prescription for tramadol, Robaxin, and Medrol Dosepak. Patient advised to follow-up with treating orthopedic Dr.  Clinical Course      ____________________________________________   FINAL CLINICAL IMPRESSION(S) / ED DIAGNOSES  Final diagnoses:  Paresthesia of left leg  Acute right-sided low back pain without sciatica      NEW MEDICATIONS STARTED DURING THIS VISIT:  New Prescriptions   METHOCARBAMOL (ROBAXIN-750) 750 MG TABLET    Take 1 tablet (750 mg total) by mouth 4 (four) times daily.   METHYLPREDNISOLONE (MEDROL DOSEPAK) 4 MG TBPK TABLET    Take Tapered dose as directed   TRAMADOL (ULTRAM) 50 MG TABLET    Take 1 tablet (50 mg total) by mouth every 6 (six) hours as needed.     Note:  This document was prepared using Systems analyst and may  include unintentional dictation errors.    Sable Feil, PA-C 03/30/16 Leary Yao, MD 03/30/16 [phone]

## 2016-06-04 ENCOUNTER — Encounter (HOSPITAL_COMMUNITY): Payer: Self-pay | Admitting: *Deleted

## 2016-06-04 ENCOUNTER — Ambulatory Visit (HOSPITAL_COMMUNITY)
Admission: EM | Admit: 2016-06-04 | Discharge: 2016-06-04 | Disposition: A | Discharge status: Home / Self Care | Payer: BLUE CROSS/BLUE SHIELD | Attending: Internal Medicine | Admitting: Internal Medicine

## 2016-06-04 DIAGNOSIS — M7581 Other shoulder lesions, right shoulder: Secondary | ICD-10-CM

## 2016-06-04 DIAGNOSIS — M7701 Medial epicondylitis, right elbow: Secondary | ICD-10-CM

## 2016-06-04 MED ORDER — NAPROXEN 500 MG PO TABS: 500.0000 mg | ORAL_TABLET | Freq: Two times a day (BID) | ORAL | 0 refills | Status: DC

## 2016-06-04 MED ORDER — CYCLOBENZAPRINE HCL 10 MG PO TABS: 10.0000 mg | ORAL_TABLET | Freq: Two times a day (BID) | ORAL | 0 refills | Status: DC | PRN

## 2016-06-04 NOTE — ED Provider Notes (Signed)
CSN: [id number]     Arrival date & time 06/04/16  1748 History   First MD Initiated Contact with Patient 06/04/16 1809     Chief Complaint  Patient presents with  . Arm Pain   (Consider location/radiation/quality/duration/timing/severity/associated sxs/prior Treatment) Patient c/o right elbow and right shoulder discomfort for one day.  She has to lift and do repetitive movements with right upper extremity.     The history is provided by the patient.  Arm Pain  This is a new problem. The current episode started yesterday. The problem occurs constantly. The problem has not changed since onset.Nothing aggravates the symptoms.    Past Medical History:  Diagnosis Date  . Arthritis   . Depression   . Diabetes mellitus    diet conttrolled  only,no meds ever  . Hip arthritis, left, post-SCFE 03/29/2011   Past Surgical History:  Procedure Laterality Date  . ABDOMINAL HYSTERECTOMY    . APPENDECTOMY    . CESAREAN SECTION    . JOINT REPLACEMENT     pins both hips  . TIBIA IM NAIL INSERTION Right 05/15/2012   Procedure: Open Reduction Internal Fixation Right Tibia;  Surgeon: Johnny Bridge, MD;  Location: [place];  Service: Orthopedics;  Laterality: Right;  . TONSILLECTOMY    . TOTAL HIP ARTHROPLASTY  03/29/2011   Procedure: TOTAL HIP ARTHROPLASTY;  Surgeon: Johnny Bridge;  Location: [facility];  Service: Orthopedics;  Laterality: Left;  with removal of retained hardware (knowles pins)   History reviewed. No pertinent family history. Social History  Substance Use Topics  . Smoking status: Current Every Day Smoker    Types: Cigars    Last attempt to quit: 03/24/2011  . Smokeless tobacco: Never Used  . Alcohol use Yes     Comment: rarely   OB History    No data available     Review of Systems  Constitutional: Negative.   HENT: Negative.   Eyes: Negative.   Respiratory: Negative.   Cardiovascular: Negative.   Gastrointestinal: Negative.   Endocrine: Negative.   Genitourinary:  Negative.   Musculoskeletal: Positive for arthralgias.  Allergic/Immunologic: Negative.   Neurological: Negative.   Hematological: Negative.   Psychiatric/Behavioral: Negative.     Allergies  Penicillins  Home Medications   Prior to Admission medications   Medication Sig Start Date End Date Taking? Authorizing Provider  buPROPion (WELLBUTRIN SR) 150 MG 12 hr tablet Take 150 mg by mouth 2 (two) times daily.    Historical Provider, MD  cyclobenzaprine (FLEXERIL) 10 MG tablet Take 1 tablet (10 mg total) by mouth 2 (two) times daily as needed for muscle spasms. 06/04/16   Lysbeth Penner, FNP  enoxaparin (LOVENOX) 40 MG/0.4ML injection Inject 0.4 mLs (40 mg total) into the skin daily. 05/15/12   Marchia Bond, MD  methocarbamol (ROBAXIN) 500 MG tablet Take 1 tablet (500 mg total) by mouth 4 (four) times daily. 05/15/12   Marchia Bond, MD  methocarbamol (ROBAXIN-750) 750 MG tablet Take 1 tablet (750 mg total) by mouth 4 (four) times daily. 03/30/16   Sable Feil, PA-C  methylPREDNISolone (MEDROL DOSEPAK) 4 MG TBPK tablet Take Tapered dose as directed 03/30/16   Sable Feil, PA-C  naproxen (NAPROSYN) 500 MG tablet Take 1 tablet (500 mg total) by mouth 2 (two) times daily with a meal. 06/04/16   Lysbeth Penner, FNP  oxyCODONE-acetaminophen (PERCOCET) 10-325 MG per tablet Take 1-2 tablets by mouth every 6 (six) hours as needed for pain. MAXIMUM TOTAL ACETAMINOPHEN  DOSE IS 4000 MG PER DAY 05/15/12   Marchia Bond, MD  sennosides-docusate sodium (SENOKOT-S) 8.6-50 MG tablet Take 1 tablet by mouth daily. 05/15/12   Marchia Bond, MD  traMADol (ULTRAM) 50 MG tablet Take 1 tablet (50 mg total) by mouth every 6 (six) hours as needed. 03/30/16 03/30/17  Sable Feil, PA-C   Meds Ordered and Administered this Visit  Medications - No data to display  BP 128/78 (BP Location: Right Arm)   Pulse 78   Temp 98.6 F (37 C) (Oral)   Resp 18   SpO2 100%  No data found.   Physical Exam  Constitutional:  She appears well-developed and well-nourished.  HENT:  Head: Normocephalic.  Right Ear: External ear normal.  Left Ear: External ear normal.  Mouth/Throat: Oropharynx is clear and moist.  Eyes: Conjunctivae and EOM are normal. Pupils are equal, round, and reactive to light.  Neck: Normal range of motion. Neck supple.  Cardiovascular: Normal rate, regular rhythm and normal heart sounds.   Pulmonary/Chest: Effort normal and breath sounds normal.  Musculoskeletal: She exhibits tenderness.  TTP right medial epicondyle.  Tenderness with external and internal rotation right shoulder.  Decreased ROM with internal and external rotation.  Nursing note and vitals reviewed.   Urgent Care Course     Procedures (including critical care time)  Labs Review Labs Reviewed - No data to display  Imaging Review No results found.   Visual Acuity Review  Right Eye Distance:   Left Eye Distance:   Bilateral Distance:    Right Eye Near:   Left Eye Near:    Bilateral Near:         MDM   1. Right rotator cuff tendonitis   2. Medial epicondylitis of elbow, right    Naprosyn 500mg  one po bid x 10 days Flexeril 10mg  one po bid prn #20      Lysbeth Penner, FNP 06/04/16 [phone]

## 2016-06-04 NOTE — ED Triage Notes (Signed)
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

## 2016-06-21 ENCOUNTER — Ambulatory Visit (HOSPITAL_COMMUNITY)
Admission: EM | Admit: 2016-06-21 | Discharge: 2016-06-21 | Disposition: A | Discharge status: Home / Self Care | Payer: BLUE CROSS/BLUE SHIELD | Attending: Internal Medicine | Admitting: Internal Medicine

## 2016-06-21 ENCOUNTER — Encounter (HOSPITAL_COMMUNITY): Payer: Self-pay | Admitting: Emergency Medicine

## 2016-06-21 DIAGNOSIS — L0291 Cutaneous abscess, unspecified: Secondary | ICD-10-CM

## 2016-06-21 MED ORDER — SULFAMETHOXAZOLE-TRIMETHOPRIM 800-160 MG PO TABS: 1.0000 | ORAL_TABLET | Freq: Two times a day (BID) | ORAL | 0 refills | Status: AC

## 2016-06-21 MED ORDER — LIDOCAINE HCL 2 % IJ SOLN
INTRAMUSCULAR | Status: AC
  Filled 2016-06-21: qty 20

## 2016-06-21 NOTE — ED Triage Notes (Signed)
The patient presented to the [facility] with an abscess on her upper back x 1 week.

## 2016-06-21 NOTE — Discharge Instructions (Addendum)
Opening on upper back abscess was enlarged and drained.  Dry dressing was applied.  If needed can replace bandage with fresh one, would try to leave strip of packing in place.  Recheck abscess at urgent care in 2d.  Prescription for trimethoprim/sulfa (antibiotic good for staph germs) was [facility] to [facility] on [address].  If not improving at followup would consider adding strep coverage with something like cephalexin.  Anticipate gradual improvement in pain/swelling/drainage/redness over the next several days; may take a couple weeks to fully subside.

## 2016-06-21 NOTE — ED Provider Notes (Signed)
[place]    CSN: [id number] Arrival date & time: 06/21/16  1653     History   Chief Complaint Chief Complaint  Patient presents with  . Abscess    HPI Jessica Richardson is a 46 y.o. female. She presents today with onset of drainage from a left upper back abscess that started March 29.  Increasing pain and size of the site. History of one prior abscess, lanced, occurred in her axilla. No fever, no malaise. 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    HPI  Past Medical History:  Diagnosis Date  . Arthritis   . Depression   . Diabetes mellitus    diet conttrolled  only,no meds ever  . Hip arthritis, left, post-SCFE 03/29/2011    Patient Active Problem List   Diagnosis Date Noted  . Closed fracture of right fibula and tibia 05/13/2012  . 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 Hypovolemia 05/13/2012  . Hip arthritis, left, post-SCFE, total hip Jan 2013 by Dr Mardelle Matte 03/29/2011    Past Surgical History:  Procedure Laterality Date  . ABDOMINAL HYSTERECTOMY    . APPENDECTOMY    . CESAREAN SECTION    . JOINT REPLACEMENT     pins both hips  . TIBIA IM NAIL INSERTION Right 05/15/2012   Procedure: Open Reduction Internal Fixation Right Tibia;  Surgeon: Johnny Bridge, MD;  Location: [place];  Service: Orthopedics;  Laterality: Right;  . TONSILLECTOMY    . TOTAL HIP ARTHROPLASTY  03/29/2011   Procedure: TOTAL HIP ARTHROPLASTY;  Surgeon: Johnny Bridge;  Location: [place];  Service: Orthopedics;  Laterality: Left;  with removal of retained hardware (knowles pins)     Home Medications    Prior to Admission medications   Medication Sig Start Date End Date Taking? Authorizing Provider  buPROPion (WELLBUTRIN SR) 150 MG 12 hr tablet Take 150 mg by mouth 2 (two) times daily.   Yes Historical Provider, MD  sulfamethoxazole-trimethoprim (BACTRIM DS,SEPTRA DS) 800-160 MG tablet Take 1 tablet by mouth 2 (two) times daily. 06/21/16  07/01/16  Sherlene Shams, MD    Family History History reviewed. No pertinent family history.  Social History Social History  Substance Use Topics  . Smoking status: Current Every Day Smoker    Types: Cigars    Last attempt to quit: 03/24/2011  . Smokeless tobacco: Never Used  . Alcohol use Yes     Comment: rarely     Allergies   Penicillins   Review of Systems Review of Systems  All other systems reviewed and are negative.    Physical Exam Triage Vital Signs ED Triage Vitals  Enc Vitals Group     BP 06/21/16 1705 132/83     Pulse Rate 06/21/16 1705 (!) 102     Resp 06/21/16 1705 20     Temp 06/21/16 1705 98.6 F (37 C)     Temp Source 06/21/16 1705 Oral     SpO2 06/21/16 1705 96 %     Weight --      Height --      Pain Score 06/21/16 1703 10     Pain Loc --    Updated Vital Signs BP 132/83 (BP Location: Right Wrist)   Pulse (!) 102 Comment: notified cma  Temp 98.6 F (37 C) (Oral)   Resp 20   SpO2 96%   Physical Exam  Constitutional: She is oriented to person, place, and time.  No distress.  Alert, nicely groomed  HENT:  Head: Atraumatic.  Eyes:  Conjugate gaze, no eye redness/drainage  Neck: Neck supple.  Cardiovascular: Normal rate.   Pulmonary/Chest: No respiratory distress.  Abdominal: She exhibits no distension.  Musculoskeletal: Normal range of motion.  No leg swelling  Neurological: She is alert and oriented to person, place, and time.  Skin: Skin is warm and dry.  No cyanosis Indurated/red area left upper back, quite tender, measures 2 x 5". Draining from central location, purulent material easily expressible.  Nursing note and vitals reviewed.    UC Treatments / Results   Procedures Procedures (including critical care time) Skin prepped with Betadine, and infiltrated around draining area with 2% lidocaine without epi. The open area was enlarged slightly, with an 11 blade, and purulent material was expressed. The wound was irrigated  with normal saline. A small strip of packing was placed, and a dry dressing.    Final Clinical Impressions(s) / UC Diagnoses   Final diagnoses:  Abscess   Opening on upper back abscess was enlarged and drained.  Dry dressing was applied.  If needed can replace bandage with fresh one, would try to leave strip of packing in place.  Recheck abscess at urgent care in 2d.  Prescription for trimethoprim/sulfa (antibiotic good for staph germs) [facility] [address]  If not improving at followup would consider adding strep coverage with something like cephalexin.  Anticipate gradual improvement in pain/swelling/drainage/redness over the next several days; may take a couple weeks to fully subside.    New Prescriptions Discharge Medication List as of 06/21/2016  5:54 PM    START taking these medications   Details  sulfamethoxazole-trimethoprim (BACTRIM DS,SEPTRA DS) 800-160 MG tablet Take 1 tablet by mouth 2 (two) times daily., Starting Mon 06/21/2016, Until Thu 07/01/2016, Normal         Sherlene Shams, MD 06/23/16 2237

## 2016-06-23 ENCOUNTER — Ambulatory Visit (HOSPITAL_COMMUNITY)
Admission: EM | Admit: 2016-06-23 | Discharge: 2016-06-23 | Disposition: A | Discharge status: Home / Self Care | Payer: BLUE CROSS/BLUE SHIELD | Attending: Internal Medicine | Admitting: Internal Medicine

## 2016-06-23 ENCOUNTER — Encounter (HOSPITAL_COMMUNITY): Payer: Self-pay | Admitting: Family Medicine

## 2016-06-23 DIAGNOSIS — Z5189 Encounter for other specified aftercare: Secondary | ICD-10-CM

## 2016-06-23 NOTE — ED Provider Notes (Signed)
CSN: [id number]     Arrival date & time 06/23/16  1636 History   First MD Initiated Contact with Patient 06/23/16 1722     Chief Complaint  Patient presents with  . Wound Check   (Consider location/radiation/quality/duration/timing/severity/associated sxs/prior Treatment) 46 year old female is here for a wound check and packing removal 2 days after she had an I&D of an abscess in the left upper back. She was placed on Septra DS and she has had only 4 doses since that time. There is induration and tenderness left of the area of incision. No drainage from the wound now. None expressed. Patient still complaining of tenderness and pain.      Past Medical History:  Diagnosis Date  . Arthritis   . Depression   . Diabetes mellitus    diet conttrolled  only,no meds ever  . Hip arthritis, left, post-SCFE 03/29/2011   Past Surgical History:  Procedure Laterality Date  . ABDOMINAL HYSTERECTOMY    . APPENDECTOMY    . CESAREAN SECTION    . JOINT REPLACEMENT     pins both hips  . TIBIA IM NAIL INSERTION Right 05/15/2012   Procedure: Open Reduction Internal Fixation Right Tibia;  Surgeon: Johnny Bridge, MD;  Location: [place];  Service: Orthopedics;  Laterality: Right;  . TONSILLECTOMY    . TOTAL HIP ARTHROPLASTY  03/29/2011   Procedure: TOTAL HIP ARTHROPLASTY;  Surgeon: Johnny Bridge;  Location: [place];  Service: Orthopedics;  Laterality: Left;  with removal of retained hardware (knowles pins)   History reviewed. No pertinent family history. Social History  Substance Use Topics  . Smoking status: Current Every Day Smoker    Types: Cigars    Last attempt to quit: 03/24/2011  . Smokeless tobacco: Never Used  . Alcohol use Yes     Comment: rarely   OB History    No data available     Review of Systems  Constitutional: Negative.   All other systems reviewed and are negative.   Allergies  Penicillins  Home Medications   Prior to Admission medications   Medication Sig Start Date End  Date Taking? Authorizing Provider  buPROPion (WELLBUTRIN SR) 150 MG 12 hr tablet Take 150 mg by mouth 2 (two) times daily.    Historical Provider, MD  sulfamethoxazole-trimethoprim (BACTRIM DS,SEPTRA DS) 800-160 MG tablet Take 1 tablet by mouth 2 (two) times daily. 06/21/16 07/01/16  Sherlene Shams, MD   Meds Ordered and Administered this Visit  Medications - No data to display  BP 134/78   Pulse (!) 105   Temp 99.1 F (37.3 C) (Oral)   Resp 18   SpO2 96%  No data found.   Physical Exam  Constitutional: She is oriented to person, place, and time. She appears well-developed and well-nourished.  Neck: Normal range of motion. Neck supple.  Pulmonary/Chest: Effort normal.  Neurological: She is alert and oriented to person, place, and time.  Skin: Skin is warm and dry.  Abscess as per history of present illness. Induration extending approximately 5 inches laterally from the incision site. Positive for tenderness. No drainage.  Nursing note and vitals reviewed.   Urgent Care Course     Procedures (including critical care time)  Labs Review Labs Reviewed - No data to display  Imaging Review No results found.   Visual Acuity Review  Right Eye Distance:   Left Eye Distance:   Bilateral Distance:    Right Eye Near:   Left Eye Near:  Bilateral Near:         MDM   1. Wound check, abscess    Packing was removed. Band-Aid replaced as there is no additional drainage. You may shower. Recommend applying very warm compresses 4 times a day. Take the  antibiotics as directed until gone. If in 2 days there is no improvement or there is worsening, increase in size and pain of the lesion return to the urgent care. If worsening or likely have to have another [place] of the lesion.    Janne Napoleon, NP 06/23/16 1742

## 2016-06-23 NOTE — ED Notes (Signed)
Patient's wound covered with nonadherent dressing.

## 2016-06-23 NOTE — Discharge Instructions (Signed)
You may shower. Recommend applying very warm compresses 4 times a day. Take the  antibiotics as directed until gone. If in 2 days there is no improvement or there is worsening, increase in size and pain of the lesion return to the urgent care. If worsening may have to have another [place] of the lesion.

## 2016-06-23 NOTE — ED Triage Notes (Signed)
Pt here for wound recheck to upper back.

## 2016-09-10 ENCOUNTER — Encounter: Payer: Self-pay | Admitting: Medical Oncology

## 2016-09-10 ENCOUNTER — Emergency Department
Admission: EM | Admit: 2016-09-10 | Discharge: 2016-09-10 | Disposition: A | Discharge status: Home / Self Care | Payer: BLUE CROSS/BLUE SHIELD | Attending: Emergency Medicine | Admitting: Emergency Medicine

## 2016-09-10 DIAGNOSIS — E119 Type 2 diabetes mellitus without complications: Secondary | ICD-10-CM | POA: Insufficient documentation

## 2016-09-10 DIAGNOSIS — F17290 Nicotine dependence, other tobacco product, uncomplicated: Secondary | ICD-10-CM | POA: Insufficient documentation

## 2016-09-10 DIAGNOSIS — M791 Myalgia: Secondary | ICD-10-CM | POA: Insufficient documentation

## 2016-09-10 DIAGNOSIS — M542 Cervicalgia: Secondary | ICD-10-CM | POA: Diagnosis present

## 2016-09-10 DIAGNOSIS — M7918 Myalgia, other site: Secondary | ICD-10-CM

## 2016-09-10 MED ORDER — CYCLOBENZAPRINE HCL 5 MG PO TABS: ORAL_TABLET | ORAL | 0 refills | Status: DC
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
## 2016-09-17 ENCOUNTER — Ambulatory Visit (HOSPITAL_COMMUNITY)
Admission: EM | Admit: 2016-09-17 | Discharge: 2016-09-17 | Disposition: A | Discharge status: Home / Self Care | Payer: BLUE CROSS/BLUE SHIELD | Attending: Internal Medicine | Admitting: Internal Medicine

## 2016-09-17 ENCOUNTER — Encounter (HOSPITAL_COMMUNITY): Payer: Self-pay | Admitting: Emergency Medicine

## 2016-09-17 DIAGNOSIS — L0291 Cutaneous abscess, unspecified: Secondary | ICD-10-CM | POA: Diagnosis present

## 2016-09-17 DIAGNOSIS — L02212 Cutaneous abscess of back [any part, except buttock]: Secondary | ICD-10-CM

## 2016-09-17 DIAGNOSIS — Z79899 Other long term (current) drug therapy: Secondary | ICD-10-CM | POA: Diagnosis not present

## 2016-09-17 DIAGNOSIS — Z880 Allergy status to penicillin: Secondary | ICD-10-CM | POA: Diagnosis not present

## 2016-09-17 DIAGNOSIS — Z96649 Presence of unspecified artificial hip joint: Secondary | ICD-10-CM | POA: Insufficient documentation

## 2016-09-17 DIAGNOSIS — F17200 Nicotine dependence, unspecified, uncomplicated: Secondary | ICD-10-CM | POA: Insufficient documentation

## 2016-09-17 DIAGNOSIS — E119 Type 2 diabetes mellitus without complications: Secondary | ICD-10-CM | POA: Insufficient documentation

## 2016-09-17 DIAGNOSIS — F329 Major depressive disorder, single episode, unspecified: Secondary | ICD-10-CM | POA: Diagnosis not present

## 2016-09-17 MED ORDER — HYDROCODONE-ACETAMINOPHEN 5-325 MG PO TABS: 1.0000 | ORAL_TABLET | Freq: Four times a day (QID) | ORAL | 0 refills | Status: DC | PRN

## 2016-09-17 MED ORDER — CEFTRIAXONE SODIUM 1 G IJ SOLR
INTRAMUSCULAR | Status: AC
  Filled 2016-09-17: qty 10

## 2016-09-17 MED ORDER — SULFAMETHOXAZOLE-TRIMETHOPRIM 800-160 MG PO TABS: 1.0000 | ORAL_TABLET | Freq: Two times a day (BID) | ORAL | 0 refills | Status: AC

## 2016-09-17 MED ORDER — CEFTRIAXONE SODIUM 1 G IJ SOLR
1.0000 g | Freq: Once | INTRAMUSCULAR | Status: AC
  Administered 2016-09-17: 1 g via INTRAMUSCULAR

## 2016-09-17 MED ORDER — STERILE WATER FOR INJECTION IJ SOLN
INTRAMUSCULAR | Status: AC
  Filled 2016-09-17: qty 10

## 2016-09-17 NOTE — ED Notes (Signed)
PT had acetaminophen at 8am. PT does have a ride home.

## 2016-09-17 NOTE — ED Triage Notes (Signed)
PT reports fever 2 nights ago

## 2016-09-17 NOTE — ED Triage Notes (Signed)
PT reports an abscess on back that started Sunday. PT has had an abscess over this area before.

## 2016-09-17 NOTE — Discharge Instructions (Signed)
Giving injection of Rocephin here in the clinic, and I have sent a prescription to her pharmacy for Bactrim, take one tablet twice a day for 7 days. Keep your wound clean, dry, change her bandages at least once a day, monitor for any signs or symptoms of infection.I have also prescribed a medicine for pain called hydrocodone, this medicine is a narcotic, it will cause drowsiness, and it is addictive. Do not take more than what is necessary, do not drink alcohol while taking, and do not operate any heavy machinery while taking this medicine. If your symptoms worsen, return to clinic as needed.

## 2016-09-17 NOTE — ED Provider Notes (Signed)
CSN: [id number]     Arrival date & time 09/17/16  1659 History   First MD Initiated Contact with Patient 09/17/16 1800     Chief Complaint  Patient presents with  . Abscess   (Consider location/radiation/quality/duration/timing/severity/associated sxs/prior Treatment) ROSALYN ARCHAMBAULT is a 46 y.o. female with a past history of arthritis, depression, diabetes, hip replacement, who presents to the [facility] urgent care with a chief complaint of an abscess to the back. She denies any fever, chills, nausea, or other systemic symptoms. She has had several abscesses in the past, and I&D done before. Has no other complaint at this time.    Abscess  Location:  Torso Torso abscess location:  Upper back Size:  4 cm Abscess quality: draining, painful and redness   Red streaking: no   Duration:  3 days Progression:  Worsening Pain details:    Quality:  Pressure and sharp   Severity:  Severe   Duration:  3 days   Timing:  Constant   Progression:  Worsening Chronicity:  Recurrent Context: diabetes   Relieved by:  Draining/squeezing Ineffective treatments:  None tried Risk factors: prior abscess     Past Medical History:  Diagnosis Date  . Arthritis   . Depression   . Diabetes mellitus    diet conttrolled  only,no meds ever  . Hip arthritis, left, post-SCFE 03/29/2011   Past Surgical History:  Procedure Laterality Date  . ABDOMINAL HYSTERECTOMY    . APPENDECTOMY    . CESAREAN SECTION    . JOINT REPLACEMENT     pins both hips  . TIBIA IM NAIL INSERTION Right 05/15/2012   Procedure: Open Reduction Internal Fixation Right Tibia;  Surgeon: Johnny Bridge, MD;  Location: [place];  Service: Orthopedics;  Laterality: Right;  . TONSILLECTOMY    . TOTAL HIP ARTHROPLASTY  03/29/2011   Procedure: TOTAL HIP ARTHROPLASTY;  Surgeon: Johnny Bridge;  Location: [place];  Service: Orthopedics;  Laterality: Left;  with removal of retained hardware (knowles pins)   No family history on file. Social  History  Substance Use Topics  . Smoking status: Current Every Day Smoker    Types: Cigars    Last attempt to quit: 03/24/2011  . Smokeless tobacco: Never Used  . Alcohol use No     Comment: rarely   OB History    No data available     Review of Systems  Constitutional: Negative.   HENT: Negative.   Respiratory: Negative.   Cardiovascular: Negative.   Gastrointestinal: Negative.   Musculoskeletal: Negative.   Skin:       abscess  Neurological: Negative.     Allergies  Penicillins  Home Medications   Prior to Admission medications   Medication Sig Start Date End Date Taking? Authorizing Provider  buPROPion (WELLBUTRIN SR) 150 MG 12 hr tablet Take 150 mg by mouth 2 (two) times daily.   Yes [provider]  cyclobenzaprine (FLEXERIL) 5 MG tablet Take 1-2 tabs TID prn muscle spasms 09/10/16  Yes Menshew, Dannielle Karvonen, PA-C  HYDROcodone-acetaminophen (NORCO/VICODIN) 5-325 MG tablet Take 1 tablet by mouth every 6 (six) hours as needed. 09/17/16   Barnet Glasgow, NP  sulfamethoxazole-trimethoprim (BACTRIM DS,SEPTRA DS) 800-160 MG tablet Take 1 tablet by mouth 2 (two) times daily. 09/17/16 09/24/16  Barnet Glasgow, NP   Meds Ordered and Administered this Visit   Medications  cefTRIAXone (ROCEPHIN) injection 1 g (1 g Intramuscular Given 09/17/16 1835)    BP 122/74 (BP Location:  Left Wrist)   Pulse (!) 107 Comment: rn notified  Temp 99.5 F (37.5 C) (Oral)   Resp 16   Ht 5\' 5"  (1.651 m)   Wt (!) 310 lb (140.6 kg)   SpO2 100%   BMI 51.59 kg/m  No data found.   Physical Exam  Constitutional: She is oriented to person, place, and time. She appears well-developed and well-nourished. No distress.  HENT:  Head: Normocephalic.  Right Ear: External ear normal.  Left Ear: External ear normal.  Mouth/Throat: Oropharynx is clear and moist.  Eyes: Conjunctivae are normal.  Neck: Normal range of motion.  Cardiovascular: Normal rate and regular rhythm.    Pulmonary/Chest: Effort normal and breath sounds normal.  Neurological: She is alert and oriented to person, place, and time.  Skin: Skin is warm and dry. Capillary refill takes less than 2 seconds. She is not diaphoretic.  Or rubs for abscess upper thoracic back at the midline. Draining pus.  Psychiatric: She has a normal mood and affect.  Nursing note and vitals reviewed.   Urgent Care Course     .Marland KitchenIncision and Drainage Date/Time: 09/17/2016 6:52 PM Performed by: Barnet Glasgow Authorized by: Sherlene Shams   Consent:    Consent obtained:  Verbal   Consent given by:  Patient   Risks discussed:  Bleeding, incomplete drainage, infection and pain Location:    Type:  Abscess   Size:  4 cm   Location:  Trunk   Trunk location:  Back Pre-procedure details:    Skin preparation:  Betadine Anesthesia (see MAR for exact dosages):    Anesthesia method:  Local infiltration   Local anesthetic:  Lidocaine 2% w/o epi Procedure type:    Complexity:  Simple Procedure details:    Needle aspiration: no     Incision types:  Single straight   Incision depth:  Dermal   Scalpel blade:  11   Wound management:  Probed and deloculated and irrigated with saline   Drainage:  Purulent   Drainage amount:  Moderate   Wound treatment:  Wound left open   Packing materials:  None Post-procedure details:    Patient tolerance of procedure:  Tolerated well, no immediate complications    (including critical care time)  Labs Review Labs Reviewed  AEROBIC/ANAEROBIC CULTURE (SURGICAL/DEEP WOUND)    Imaging Review No results found.      MDM   1. Abscess    MALON SIDDALL is a 46 y.o. female presenting to [facility] with a chief complaint of possible abscess/cyst.  Differential diagnosis includes, but is not limited to, abscess, sebaceous cyst, dermoid cyst, furuncle, or others.  It is my impression based on the historical events and physical exam that this is a  abscess. An incision and drainage was performed in the urgent care. There is no surrounding cellulitis. I do not believe the patient has underlying necrotizing fasciitis. Patient has no known risk factors for MRSA. To cover for risk of infection we will  treat with bactrim and rocephin.     Barnet Glasgow, NP 09/17/16 1913

## 2016-09-22 LAB — AEROBIC/ANAEROBIC CULTURE W GRAM STAIN (SURGICAL/DEEP WOUND)

## 2016-09-22 LAB — AEROBIC/ANAEROBIC CULTURE (SURGICAL/DEEP WOUND)

## 2017-05-23 DIAGNOSIS — E119 Type 2 diabetes mellitus without complications: Secondary | ICD-10-CM | POA: Diagnosis not present

## 2017-05-23 DIAGNOSIS — E569 Vitamin deficiency, unspecified: Secondary | ICD-10-CM | POA: Diagnosis not present

## 2017-05-23 DIAGNOSIS — R0602 Shortness of breath: Secondary | ICD-10-CM | POA: Diagnosis not present

## 2017-05-23 DIAGNOSIS — E669 Obesity, unspecified: Secondary | ICD-10-CM | POA: Diagnosis not present

## 2017-10-28 DIAGNOSIS — R5381 Other malaise: Secondary | ICD-10-CM | POA: Diagnosis not present

## 2017-10-28 DIAGNOSIS — E034 Atrophy of thyroid (acquired): Secondary | ICD-10-CM | POA: Diagnosis not present

## 2017-10-28 DIAGNOSIS — I10 Essential (primary) hypertension: Secondary | ICD-10-CM | POA: Diagnosis not present

## 2017-10-28 DIAGNOSIS — E119 Type 2 diabetes mellitus without complications: Secondary | ICD-10-CM | POA: Diagnosis not present

## 2017-10-28 DIAGNOSIS — E669 Obesity, unspecified: Secondary | ICD-10-CM | POA: Diagnosis not present

## 2017-10-28 DIAGNOSIS — E569 Vitamin deficiency, unspecified: Secondary | ICD-10-CM | POA: Diagnosis not present

## 2017-10-28 DIAGNOSIS — R0602 Shortness of breath: Secondary | ICD-10-CM | POA: Diagnosis not present

## 2017-10-28 DIAGNOSIS — E7849 Other hyperlipidemia: Secondary | ICD-10-CM | POA: Diagnosis not present

## 2017-11-22 DIAGNOSIS — E569 Vitamin deficiency, unspecified: Secondary | ICD-10-CM | POA: Diagnosis not present

## 2017-11-22 DIAGNOSIS — E119 Type 2 diabetes mellitus without complications: Secondary | ICD-10-CM | POA: Diagnosis not present

## 2017-11-22 DIAGNOSIS — R0602 Shortness of breath: Secondary | ICD-10-CM | POA: Diagnosis not present

## 2017-11-22 DIAGNOSIS — E669 Obesity, unspecified: Secondary | ICD-10-CM | POA: Diagnosis not present

## 2017-11-22 DIAGNOSIS — Z0000 Encounter for general adult medical examination without abnormal findings: Secondary | ICD-10-CM | POA: Diagnosis not present

## 2017-11-23 ENCOUNTER — Other Ambulatory Visit: Payer: Self-pay | Admitting: Internal Medicine

## 2017-11-23 ENCOUNTER — Other Ambulatory Visit: Payer: Self-pay | Admitting: Cardiology

## 2017-11-23 DIAGNOSIS — Z1231 Encounter for screening mammogram for malignant neoplasm of breast: Secondary | ICD-10-CM

## 2018-03-18 ENCOUNTER — Emergency Department (HOSPITAL_COMMUNITY)
Admission: EM | Admit: 2018-03-18 | Discharge: 2018-03-18 | Disposition: A | Discharge status: Home / Self Care | Payer: BLUE CROSS/BLUE SHIELD | Attending: Emergency Medicine | Admitting: Emergency Medicine

## 2018-03-18 ENCOUNTER — Emergency Department (HOSPITAL_COMMUNITY): Payer: BLUE CROSS/BLUE SHIELD

## 2018-03-18 ENCOUNTER — Encounter (HOSPITAL_COMMUNITY): Payer: Self-pay

## 2018-03-18 DIAGNOSIS — M25562 Pain in left knee: Secondary | ICD-10-CM | POA: Insufficient documentation

## 2018-03-18 DIAGNOSIS — F17210 Nicotine dependence, cigarettes, uncomplicated: Secondary | ICD-10-CM | POA: Diagnosis not present

## 2018-03-18 DIAGNOSIS — E119 Type 2 diabetes mellitus without complications: Secondary | ICD-10-CM | POA: Diagnosis not present

## 2018-03-18 DIAGNOSIS — Z79899 Other long term (current) drug therapy: Secondary | ICD-10-CM | POA: Diagnosis not present

## 2018-03-18 DIAGNOSIS — Z96642 Presence of left artificial hip joint: Secondary | ICD-10-CM | POA: Insufficient documentation

## 2018-03-18 DIAGNOSIS — M25462 Effusion, left knee: Secondary | ICD-10-CM | POA: Diagnosis not present

## 2018-03-18 DIAGNOSIS — M7989 Other specified soft tissue disorders: Secondary | ICD-10-CM | POA: Diagnosis not present

## 2018-03-18 MED ORDER — HYDROCODONE-ACETAMINOPHEN 5-325 MG PO TABS
1.0000 | ORAL_TABLET | Freq: Once | ORAL | Status: AC
  Administered 2018-03-18: 1 via ORAL
  Filled 2018-03-18: qty 1

## 2018-03-18 MED ORDER — HYDROCODONE-ACETAMINOPHEN 5-325 MG PO TABS: 1.0000 | ORAL_TABLET | Freq: Four times a day (QID) | ORAL | 0 refills | Status: DC | PRN

## 2018-03-18 MED ORDER — SODIUM CHLORIDE 0.9 % IV SOLN: INTRAVENOUS | Status: DC

## 2018-03-18 MED ORDER — NAPROXEN 250 MG PO TABS
500.0000 mg | ORAL_TABLET | Freq: Once | ORAL | Status: AC
  Administered 2018-03-18: 500 mg via ORAL
  Filled 2018-03-18: qty 2

## 2018-03-18 MED ORDER — CLINDAMYCIN PHOSPHATE 600 MG/50ML IV SOLN: 600.0000 mg | Freq: Once | INTRAVENOUS | Status: DC

## 2018-03-18 MED ORDER — NAPROXEN 500 MG PO TABS: 500.0000 mg | ORAL_TABLET | Freq: Two times a day (BID) | ORAL | 0 refills | Status: DC

## 2018-03-18 NOTE — Discharge Instructions (Signed)
Try to take it easy and ice and heat intermittently.  Call the orthopedist for follow-up as you may need further treatment with knee injections.

## 2018-03-18 NOTE — ED Provider Notes (Signed)
[facility] EMERGENCY DEPARTMENT Provider Note   CSN: [id number] Arrival date & time: 03/18/18  1127     History   Chief Complaint Chief Complaint  Patient presents with  . Knee Pain    HPI Jessica Richardson is a 47 y.o. female.  Patient is a 47 year old female with a history of diabetes, depression, arthritis who presents today with worsening pain in her left knee.  He states initially she noticed some swelling in the medial aspect of her left knee 2 weeks ago but then since Christmas the pain has moved to affect her whole knee.  When she attempts to bend her knee pain shoots up through her kneecap.  She denies any falls.  She has had no trauma to this knee in the past.  She has had no fever and her knee has not been red or hot.  She has not had any swelling in her lower leg or numbness or tingling in her foot.  Is been taking Tylenol but it is not helping.  The history is provided by the patient.  Knee Pain      Past Medical History:  Diagnosis Date  . Arthritis   . Depression   . Diabetes mellitus    diet conttrolled  only,no meds ever  . Hip arthritis, left, post-SCFE 03/29/2011    Patient Active Problem List   Diagnosis Date Noted  . Closed fracture of right fibula and tibia 05/13/2012  . 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 Hypovolemia 05/13/2012  . Hip arthritis, left, post-SCFE, total hip Jan 2013 by Dr Mardelle Matte 03/29/2011    Past Surgical History:  Procedure Laterality Date  . ABDOMINAL HYSTERECTOMY    . APPENDECTOMY    . CESAREAN SECTION    . JOINT REPLACEMENT     pins both hips  . TIBIA IM NAIL INSERTION Right 05/15/2012   Procedure: Open Reduction Internal Fixation Right Tibia;  Surgeon: Johnny Bridge, MD;  Location: [place];  Service: Orthopedics;  Laterality: Right;  . TONSILLECTOMY    . TOTAL HIP ARTHROPLASTY  03/29/2011   Procedure: TOTAL HIP ARTHROPLASTY;  Surgeon: Johnny Bridge;  Location: [place];  Service: Orthopedics;  Laterality: Left;  with removal of retained hardware (knowles pins)     OB History   No obstetric history on file.      Home Medications    Prior to Admission medications   Medication Sig Start Date End Date Taking? Authorizing Provider  buPROPion (WELLBUTRIN SR) 150 MG 12 hr tablet Take 150 mg by mouth 2 (two) times daily.    [provider]  cyclobenzaprine (FLEXERIL) 5 MG tablet Take 1-2 tabs TID prn muscle spasms 09/10/16   Menshew, Dannielle Karvonen, PA-C  HYDROcodone-acetaminophen (NORCO/VICODIN) 5-325 MG tablet Take 1 tablet by mouth every 6 (six) hours as needed. 09/17/16   Barnet Glasgow, NP    Family History History reviewed. No pertinent family history.  Social History Social History   Tobacco Use  . Smoking status: Current Every Day Smoker    Types: Cigars    Last attempt to quit: 03/24/2011    Years since quitting: 6.9  . Smokeless tobacco: Never Used  Substance Use Topics  . Alcohol use: No    Comment: rarely  . Drug use: No     Allergies   Penicillins   Review of Systems Review of Systems  All other systems reviewed and are negative.  Physical Exam Updated Vital Signs BP 132/78 (BP Location: Right Arm)   Pulse 88   Temp 98.1 F (36.7 C) (Oral)   Resp 18   SpO2 99%   Physical Exam Vitals signs and nursing note reviewed.  Constitutional:      General: She is not in acute distress.    Appearance: She is well-developed.  HENT:     Head: Normocephalic and atraumatic.  Neck:     Musculoskeletal: Normal range of motion and neck supple.  Cardiovascular:     Rate and Rhythm: Normal rate.  Pulmonary:     Effort: Pulmonary effort is normal.  Musculoskeletal:        General: Swelling and tenderness present.     Left knee: She exhibits swelling, effusion and bony tenderness. She exhibits normal range of motion. Tenderness found. Medial joint line and lateral joint line tenderness noted.  Skin:    General:  Skin is warm and dry.     Findings: No erythema or rash.  Neurological:     Mental Status: She is alert and oriented to person, place, and time.  Psychiatric:        Behavior: Behavior normal.      ED Treatments / Results  Labs (all labs ordered are listed, but only abnormal results are displayed) Labs Reviewed - No data to display  EKG None  Radiology Dg Knee Complete 4 Views Left  Result Date: 03/18/2018 CLINICAL DATA:  Left knee pain and swelling. EXAM: LEFT KNEE - COMPLETE 4+ VIEW COMPARISON:  03/05/2013 FINDINGS: There is no fracture or dislocation or joint effusion. There are minimal tricompartmental marginal osteophytes. Soft tissues are normal. IMPRESSION: No acute osseous injury of the left knee. Electronically Signed   By: Lorriane Shire M.D.   On: 03/18/2018 14:01    Procedures Procedures (including critical care time)  Medications Ordered in ED Medications  clindamycin (CLEOCIN) IVPB 600 mg (has no administration in time range)  0.9 %  sodium chloride infusion (has no administration in time range)  HYDROcodone-acetaminophen (NORCO/VICODIN) 5-325 MG per tablet 1 tablet (1 tablet Oral Given 03/18/18 1314)  naproxen (NAPROSYN) tablet 500 mg (500 mg Oral Given 03/18/18 1314)     Initial Impression / Assessment and Plan / ED Course  I have reviewed the triage vital signs and the nursing notes.  Pertinent labs & imaging results that were available during my care of the patient were reviewed by me and considered in my medical decision making (see chart for details).     Patient presenting with nontraumatic left knee pain and signs of effusion on exam.  There is no erythema, warmth or concern for septic joint. Concern for possible effusion from arthritis.  Low suspicion for gout or DVT at this time.  X-rays pending  2:19 PM With evidence of some arthritis but no acute findings.  Patient will be given anti-inflammatories and a small prescription for pain medication  and was given referral to orthopedics. Final Clinical Impressions(s) / ED Diagnoses   Final diagnoses:  Effusion of left knee  Acute pain of left knee    ED Discharge Orders         Ordered    HYDROcodone-acetaminophen (NORCO/VICODIN) 5-325 MG tablet  Every 6 hours PRN     03/18/18 1421    naproxen (NAPROSYN) 500 MG tablet  2 times daily     03/18/18 1421           Blanchie Dessert, MD 03/18/18 1421

## 2018-03-18 NOTE — ED Notes (Signed)
Pt discharged from ED; instructions provided and scripts given; Pt encouraged to return to ED if symptoms worsen and to f/u with PCP; Pt verbalized understanding of all instructions 

## 2018-03-18 NOTE — ED Triage Notes (Signed)
Pt noted knot on medial aspect of her left knee approx 2 wks ago; pt states that knot was soft at the time, but her knee has gradually become more swollen and painful over the time. Pt states that she has been taking tylenol for pain and alternating heat with ice without relief

## 2018-03-31 DIAGNOSIS — M25562 Pain in left knee: Secondary | ICD-10-CM | POA: Diagnosis not present

## 2018-04-28 DIAGNOSIS — M25562 Pain in left knee: Secondary | ICD-10-CM | POA: Diagnosis not present

## 2018-04-28 DIAGNOSIS — M25552 Pain in left hip: Secondary | ICD-10-CM | POA: Diagnosis not present

## 2018-06-30 ENCOUNTER — Emergency Department (HOSPITAL_COMMUNITY)
Admission: EM | Admit: 2018-06-30 | Discharge: 2018-07-01 | Disposition: A | Discharge status: Home / Self Care | Payer: BLUE CROSS/BLUE SHIELD | Attending: Emergency Medicine | Admitting: Emergency Medicine

## 2018-06-30 ENCOUNTER — Other Ambulatory Visit: Payer: Self-pay

## 2018-06-30 ENCOUNTER — Encounter (HOSPITAL_COMMUNITY): Payer: Self-pay | Admitting: Emergency Medicine

## 2018-06-30 DIAGNOSIS — M546 Pain in thoracic spine: Secondary | ICD-10-CM | POA: Diagnosis not present

## 2018-06-30 DIAGNOSIS — M25511 Pain in right shoulder: Secondary | ICD-10-CM | POA: Insufficient documentation

## 2018-06-30 DIAGNOSIS — M542 Cervicalgia: Secondary | ICD-10-CM | POA: Diagnosis not present

## 2018-06-30 DIAGNOSIS — R51 Headache: Secondary | ICD-10-CM | POA: Diagnosis not present

## 2018-06-30 DIAGNOSIS — E119 Type 2 diabetes mellitus without complications: Secondary | ICD-10-CM | POA: Insufficient documentation

## 2018-06-30 DIAGNOSIS — Z79899 Other long term (current) drug therapy: Secondary | ICD-10-CM | POA: Diagnosis not present

## 2018-06-30 DIAGNOSIS — F17210 Nicotine dependence, cigarettes, uncomplicated: Secondary | ICD-10-CM | POA: Insufficient documentation

## 2018-06-30 DIAGNOSIS — M7918 Myalgia, other site: Secondary | ICD-10-CM | POA: Diagnosis not present

## 2018-06-30 DIAGNOSIS — M25512 Pain in left shoulder: Secondary | ICD-10-CM | POA: Diagnosis not present
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
## 2018-07-01 MED ORDER — IBUPROFEN 600 MG PO TABS: 600.0000 mg | ORAL_TABLET | Freq: Four times a day (QID) | ORAL | 0 refills | Status: AC | PRN

## 2018-07-01 MED ORDER — IBUPROFEN 400 MG PO TABS
600.0000 mg | ORAL_TABLET | Freq: Once | ORAL | Status: AC
  Administered 2018-07-01: 600 mg via ORAL
  Filled 2018-07-01: qty 1

## 2018-07-01 MED ORDER — CYCLOBENZAPRINE HCL 10 MG PO TABS: 10.0000 mg | ORAL_TABLET | Freq: Three times a day (TID) | ORAL | 0 refills | Status: AC | PRN

## 2018-07-01 MED ORDER — CYCLOBENZAPRINE HCL 10 MG PO TABS
10.0000 mg | ORAL_TABLET | Freq: Once | ORAL | Status: AC
  Administered 2018-07-01: 10 mg via ORAL
  Filled 2018-07-01: qty 1
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
## 2018-07-03 ENCOUNTER — Emergency Department (HOSPITAL_COMMUNITY)
Admission: EM | Admit: 2018-07-03 | Discharge: 2018-07-03 | Disposition: A | Discharge status: Home / Self Care | Payer: BLUE CROSS/BLUE SHIELD | Attending: Emergency Medicine | Admitting: Emergency Medicine

## 2018-07-03 ENCOUNTER — Encounter (HOSPITAL_COMMUNITY): Payer: Self-pay | Admitting: *Deleted

## 2018-07-03 ENCOUNTER — Other Ambulatory Visit: Payer: Self-pay

## 2018-07-03 ENCOUNTER — Emergency Department (HOSPITAL_COMMUNITY): Payer: BLUE CROSS/BLUE SHIELD

## 2018-07-03 DIAGNOSIS — M25552 Pain in left hip: Secondary | ICD-10-CM | POA: Insufficient documentation

## 2018-07-03 DIAGNOSIS — E119 Type 2 diabetes mellitus without complications: Secondary | ICD-10-CM | POA: Diagnosis not present

## 2018-07-03 DIAGNOSIS — Z79899 Other long term (current) drug therapy: Secondary | ICD-10-CM | POA: Insufficient documentation

## 2018-07-03 DIAGNOSIS — M79605 Pain in left leg: Secondary | ICD-10-CM | POA: Diagnosis not present

## 2018-07-03 DIAGNOSIS — S79912A Unspecified injury of left hip, initial encounter: Secondary | ICD-10-CM | POA: Diagnosis not present

## 2018-07-03 DIAGNOSIS — F17290 Nicotine dependence, other tobacco product, uncomplicated: Secondary | ICD-10-CM | POA: Insufficient documentation

## 2018-07-03 MED ORDER — ACETAMINOPHEN 325 MG PO TABS
650.0000 mg | ORAL_TABLET | Freq: Once | ORAL | Status: AC
  Administered 2018-07-03: 650 mg via ORAL
  Filled 2018-07-03: qty 2

## 2018-07-03 NOTE — ED Notes (Signed)
Pt verbalizes understanding of discharge instructions and reports having the opportunity to have her questions answered

## 2018-07-03 NOTE — ED Notes (Signed)
Patient transported to X-ray 

## 2018-07-03 NOTE — Discharge Instructions (Addendum)
Continue Tylenol Motrin at home for pain.  Your x-ray today showed no acute findings.  There is no fracture or malalignment.  You likely have a muscle spasm or bony contusion.  Recommend ice, slowly increase your activities.
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
## 2018-07-17 ENCOUNTER — Other Ambulatory Visit: Payer: Self-pay | Admitting: Internal Medicine

## 2018-07-17 ENCOUNTER — Ambulatory Visit
Admission: RE | Admit: 2018-07-17 | Discharge: 2018-07-17 | Disposition: A | Discharge status: Home / Self Care | Payer: BLUE CROSS/BLUE SHIELD | Source: Ambulatory Visit | Attending: Internal Medicine | Admitting: Internal Medicine

## 2018-07-17 DIAGNOSIS — M545 Low back pain: Secondary | ICD-10-CM | POA: Insufficient documentation

## 2018-07-17 DIAGNOSIS — Z87891 Personal history of nicotine dependence: Secondary | ICD-10-CM | POA: Diagnosis not present

## 2018-07-17 DIAGNOSIS — H663X9 Other chronic suppurative otitis media, unspecified ear: Secondary | ICD-10-CM | POA: Diagnosis not present

## 2018-07-31 DIAGNOSIS — M545 Low back pain: Secondary | ICD-10-CM | POA: Diagnosis not present

## 2018-07-31 DIAGNOSIS — M25552 Pain in left hip: Secondary | ICD-10-CM | POA: Diagnosis not present

## 2018-08-15 DIAGNOSIS — Z87891 Personal history of nicotine dependence: Secondary | ICD-10-CM | POA: Diagnosis not present

## 2018-08-15 DIAGNOSIS — E669 Obesity, unspecified: Secondary | ICD-10-CM | POA: Diagnosis not present

## 2018-08-15 DIAGNOSIS — K210 Gastro-esophageal reflux disease with esophagitis: Secondary | ICD-10-CM | POA: Diagnosis not present

## 2018-08-15 DIAGNOSIS — E119 Type 2 diabetes mellitus without complications: Secondary | ICD-10-CM | POA: Diagnosis not present

## 2018-08-28 DIAGNOSIS — M545 Low back pain: Secondary | ICD-10-CM | POA: Diagnosis not present

## 2018-09-01 ENCOUNTER — Other Ambulatory Visit: Payer: Self-pay | Admitting: Orthopedic Surgery

## 2018-09-01 DIAGNOSIS — M545 Low back pain, unspecified: Secondary | ICD-10-CM

## 2018-09-08 ENCOUNTER — Other Ambulatory Visit: Payer: Self-pay

## 2018-09-08 ENCOUNTER — Ambulatory Visit
Admission: RE | Admit: 2018-09-08 | Discharge: 2018-09-08 | Disposition: A | Discharge status: Home / Self Care | Payer: BC Managed Care – PPO | Source: Ambulatory Visit | Attending: Orthopedic Surgery | Admitting: Orthopedic Surgery

## 2018-09-08 DIAGNOSIS — M545 Low back pain, unspecified: Secondary | ICD-10-CM

## 2018-09-11 DIAGNOSIS — M5126 Other intervertebral disc displacement, lumbar region: Secondary | ICD-10-CM | POA: Diagnosis not present

## 2018-09-11 DIAGNOSIS — M545 Low back pain: Secondary | ICD-10-CM | POA: Diagnosis not present

## 2018-09-13 DIAGNOSIS — M545 Low back pain: Secondary | ICD-10-CM | POA: Diagnosis not present

## 2018-09-13 DIAGNOSIS — M5126 Other intervertebral disc displacement, lumbar region: Secondary | ICD-10-CM | POA: Diagnosis not present

## 2018-09-13 DIAGNOSIS — M5416 Radiculopathy, lumbar region: Secondary | ICD-10-CM | POA: Diagnosis not present

## 2018-09-13 DIAGNOSIS — M6281 Muscle weakness (generalized): Secondary | ICD-10-CM | POA: Diagnosis not present

## 2018-09-15 DIAGNOSIS — M545 Low back pain: Secondary | ICD-10-CM | POA: Diagnosis not present

## 2018-09-15 DIAGNOSIS — M6281 Muscle weakness (generalized): Secondary | ICD-10-CM | POA: Diagnosis not present

## 2018-09-15 DIAGNOSIS — M5126 Other intervertebral disc displacement, lumbar region: Secondary | ICD-10-CM | POA: Diagnosis not present

## 2018-09-15 DIAGNOSIS — M5416 Radiculopathy, lumbar region: Secondary | ICD-10-CM | POA: Diagnosis not present

## 2018-09-18 DIAGNOSIS — M545 Low back pain: Secondary | ICD-10-CM | POA: Diagnosis not present

## 2018-09-18 DIAGNOSIS — M5126 Other intervertebral disc displacement, lumbar region: Secondary | ICD-10-CM | POA: Diagnosis not present

## 2018-09-18 DIAGNOSIS — M5416 Radiculopathy, lumbar region: Secondary | ICD-10-CM | POA: Diagnosis not present

## 2018-09-18 DIAGNOSIS — M6281 Muscle weakness (generalized): Secondary | ICD-10-CM | POA: Diagnosis not present

## 2018-09-25 DIAGNOSIS — M6281 Muscle weakness (generalized): Secondary | ICD-10-CM | POA: Diagnosis not present

## 2018-09-25 DIAGNOSIS — M5416 Radiculopathy, lumbar region: Secondary | ICD-10-CM | POA: Diagnosis not present

## 2018-09-25 DIAGNOSIS — M5126 Other intervertebral disc displacement, lumbar region: Secondary | ICD-10-CM | POA: Diagnosis not present

## 2018-09-25 DIAGNOSIS — M545 Low back pain: Secondary | ICD-10-CM | POA: Diagnosis not present

## 2018-09-27 DIAGNOSIS — M5416 Radiculopathy, lumbar region: Secondary | ICD-10-CM | POA: Diagnosis not present

## 2018-09-27 DIAGNOSIS — M6281 Muscle weakness (generalized): Secondary | ICD-10-CM | POA: Diagnosis not present

## 2018-09-27 DIAGNOSIS — M5126 Other intervertebral disc displacement, lumbar region: Secondary | ICD-10-CM | POA: Diagnosis not present

## 2018-09-27 DIAGNOSIS — M545 Low back pain: Secondary | ICD-10-CM | POA: Diagnosis not present

## 2018-10-02 DIAGNOSIS — M5416 Radiculopathy, lumbar region: Secondary | ICD-10-CM | POA: Diagnosis not present

## 2018-10-02 DIAGNOSIS — M545 Low back pain: Secondary | ICD-10-CM | POA: Diagnosis not present

## 2018-10-02 DIAGNOSIS — M6281 Muscle weakness (generalized): Secondary | ICD-10-CM | POA: Diagnosis not present

## 2018-10-02 DIAGNOSIS — M5126 Other intervertebral disc displacement, lumbar region: Secondary | ICD-10-CM | POA: Diagnosis not present

## 2018-10-04 DIAGNOSIS — M5416 Radiculopathy, lumbar region: Secondary | ICD-10-CM | POA: Diagnosis not present

## 2018-10-04 DIAGNOSIS — M5126 Other intervertebral disc displacement, lumbar region: Secondary | ICD-10-CM | POA: Diagnosis not present

## 2018-10-04 DIAGNOSIS — M6281 Muscle weakness (generalized): Secondary | ICD-10-CM | POA: Diagnosis not present

## 2018-10-04 DIAGNOSIS — M545 Low back pain: Secondary | ICD-10-CM | POA: Diagnosis not present

## 2018-10-09 DIAGNOSIS — M545 Low back pain: Secondary | ICD-10-CM | POA: Diagnosis not present

## 2018-10-09 DIAGNOSIS — M5126 Other intervertebral disc displacement, lumbar region: Secondary | ICD-10-CM | POA: Diagnosis not present

## 2018-10-09 DIAGNOSIS — M5416 Radiculopathy, lumbar region: Secondary | ICD-10-CM | POA: Diagnosis not present

## 2018-10-09 DIAGNOSIS — M6281 Muscle weakness (generalized): Secondary | ICD-10-CM | POA: Diagnosis not present

## 2018-10-11 DIAGNOSIS — M6281 Muscle weakness (generalized): Secondary | ICD-10-CM | POA: Diagnosis not present

## 2018-10-11 DIAGNOSIS — M5126 Other intervertebral disc displacement, lumbar region: Secondary | ICD-10-CM | POA: Diagnosis not present

## 2018-10-11 DIAGNOSIS — M5416 Radiculopathy, lumbar region: Secondary | ICD-10-CM | POA: Diagnosis not present

## 2018-10-11 DIAGNOSIS — M545 Low back pain: Secondary | ICD-10-CM | POA: Diagnosis not present

## 2018-10-16 DIAGNOSIS — M5416 Radiculopathy, lumbar region: Secondary | ICD-10-CM | POA: Diagnosis not present

## 2018-10-16 DIAGNOSIS — M6281 Muscle weakness (generalized): Secondary | ICD-10-CM | POA: Diagnosis not present

## 2018-10-16 DIAGNOSIS — M545 Low back pain: Secondary | ICD-10-CM | POA: Diagnosis not present

## 2018-10-16 DIAGNOSIS — M5126 Other intervertebral disc displacement, lumbar region: Secondary | ICD-10-CM | POA: Diagnosis not present

## 2018-10-23 DIAGNOSIS — M5126 Other intervertebral disc displacement, lumbar region: Secondary | ICD-10-CM | POA: Diagnosis not present

## 2018-10-23 DIAGNOSIS — M545 Low back pain: Secondary | ICD-10-CM | POA: Diagnosis not present

## 2018-10-30 DIAGNOSIS — M5416 Radiculopathy, lumbar region: Secondary | ICD-10-CM | POA: Diagnosis not present

## 2018-10-30 DIAGNOSIS — M6281 Muscle weakness (generalized): Secondary | ICD-10-CM | POA: Diagnosis not present

## 2018-10-30 DIAGNOSIS — M545 Low back pain: Secondary | ICD-10-CM | POA: Diagnosis not present

## 2018-10-30 DIAGNOSIS — M5126 Other intervertebral disc displacement, lumbar region: Secondary | ICD-10-CM | POA: Diagnosis not present

## 2018-11-01 DIAGNOSIS — M5416 Radiculopathy, lumbar region: Secondary | ICD-10-CM | POA: Diagnosis not present

## 2018-11-01 DIAGNOSIS — M6281 Muscle weakness (generalized): Secondary | ICD-10-CM | POA: Diagnosis not present

## 2018-11-01 DIAGNOSIS — M5126 Other intervertebral disc displacement, lumbar region: Secondary | ICD-10-CM | POA: Diagnosis not present

## 2018-11-01 DIAGNOSIS — M545 Low back pain: Secondary | ICD-10-CM | POA: Diagnosis not present

## 2018-11-10 DIAGNOSIS — M5126 Other intervertebral disc displacement, lumbar region: Secondary | ICD-10-CM | POA: Diagnosis not present

## 2018-11-10 DIAGNOSIS — M5416 Radiculopathy, lumbar region: Secondary | ICD-10-CM | POA: Diagnosis not present

## 2018-11-10 DIAGNOSIS — M545 Low back pain: Secondary | ICD-10-CM | POA: Diagnosis not present

## 2018-11-10 DIAGNOSIS — M6281 Muscle weakness (generalized): Secondary | ICD-10-CM | POA: Diagnosis not present

## 2018-11-13 DIAGNOSIS — M545 Low back pain: Secondary | ICD-10-CM | POA: Diagnosis not present

## 2018-11-13 DIAGNOSIS — M5416 Radiculopathy, lumbar region: Secondary | ICD-10-CM | POA: Diagnosis not present

## 2018-11-13 DIAGNOSIS — M5126 Other intervertebral disc displacement, lumbar region: Secondary | ICD-10-CM | POA: Diagnosis not present

## 2018-11-13 DIAGNOSIS — M6281 Muscle weakness (generalized): Secondary | ICD-10-CM | POA: Diagnosis not present

## 2018-11-15 DIAGNOSIS — M5126 Other intervertebral disc displacement, lumbar region: Secondary | ICD-10-CM | POA: Diagnosis not present

## 2018-11-15 DIAGNOSIS — M545 Low back pain: Secondary | ICD-10-CM | POA: Diagnosis not present

## 2018-11-15 DIAGNOSIS — M5416 Radiculopathy, lumbar region: Secondary | ICD-10-CM | POA: Diagnosis not present

## 2018-11-15 DIAGNOSIS — M6281 Muscle weakness (generalized): Secondary | ICD-10-CM | POA: Diagnosis not present

## 2018-12-01 DIAGNOSIS — M545 Low back pain: Secondary | ICD-10-CM | POA: Diagnosis not present

## 2018-12-01 DIAGNOSIS — M6281 Muscle weakness (generalized): Secondary | ICD-10-CM | POA: Diagnosis not present

## 2018-12-01 DIAGNOSIS — M5416 Radiculopathy, lumbar region: Secondary | ICD-10-CM | POA: Diagnosis not present

## 2018-12-01 DIAGNOSIS — M5126 Other intervertebral disc displacement, lumbar region: Secondary | ICD-10-CM | POA: Diagnosis not present

## 2018-12-04 DIAGNOSIS — M542 Cervicalgia: Secondary | ICD-10-CM | POA: Diagnosis not present

## 2018-12-04 DIAGNOSIS — M5412 Radiculopathy, cervical region: Secondary | ICD-10-CM | POA: Diagnosis not present

## 2018-12-04 DIAGNOSIS — M5416 Radiculopathy, lumbar region: Secondary | ICD-10-CM | POA: Diagnosis not present

## 2018-12-04 DIAGNOSIS — M545 Low back pain: Secondary | ICD-10-CM | POA: Diagnosis not present

## 2018-12-05 ENCOUNTER — Other Ambulatory Visit: Payer: Self-pay | Admitting: Physical Medicine and Rehabilitation

## 2018-12-05 DIAGNOSIS — M542 Cervicalgia: Secondary | ICD-10-CM

## 2018-12-15 DIAGNOSIS — M5416 Radiculopathy, lumbar region: Secondary | ICD-10-CM | POA: Diagnosis not present

## 2018-12-15 DIAGNOSIS — M5126 Other intervertebral disc displacement, lumbar region: Secondary | ICD-10-CM | POA: Diagnosis not present

## 2018-12-15 DIAGNOSIS — M6281 Muscle weakness (generalized): Secondary | ICD-10-CM | POA: Diagnosis not present

## 2018-12-15 DIAGNOSIS — M545 Low back pain: Secondary | ICD-10-CM | POA: Diagnosis not present

## 2018-12-18 DIAGNOSIS — M5416 Radiculopathy, lumbar region: Secondary | ICD-10-CM | POA: Diagnosis not present

## 2018-12-18 DIAGNOSIS — M5126 Other intervertebral disc displacement, lumbar region: Secondary | ICD-10-CM | POA: Diagnosis not present

## 2018-12-18 DIAGNOSIS — M545 Low back pain: Secondary | ICD-10-CM | POA: Diagnosis not present

## 2018-12-18 DIAGNOSIS — M6281 Muscle weakness (generalized): Secondary | ICD-10-CM | POA: Diagnosis not present

## 2018-12-20 DIAGNOSIS — M6281 Muscle weakness (generalized): Secondary | ICD-10-CM | POA: Diagnosis not present

## 2018-12-20 DIAGNOSIS — M5416 Radiculopathy, lumbar region: Secondary | ICD-10-CM | POA: Diagnosis not present

## 2018-12-20 DIAGNOSIS — M542 Cervicalgia: Secondary | ICD-10-CM | POA: Diagnosis not present

## 2018-12-20 DIAGNOSIS — M545 Low back pain: Secondary | ICD-10-CM | POA: Diagnosis not present

## 2018-12-23 ENCOUNTER — Other Ambulatory Visit: Payer: Self-pay

## 2018-12-23 ENCOUNTER — Ambulatory Visit
Admission: RE | Admit: 2018-12-23 | Discharge: 2018-12-23 | Disposition: A | Discharge status: Home / Self Care | Payer: BC Managed Care – PPO | Source: Ambulatory Visit | Attending: Physical Medicine and Rehabilitation | Admitting: Physical Medicine and Rehabilitation

## 2018-12-23 DIAGNOSIS — M4802 Spinal stenosis, cervical region: Secondary | ICD-10-CM | POA: Diagnosis not present

## 2018-12-23 DIAGNOSIS — M542 Cervicalgia: Secondary | ICD-10-CM

## 2018-12-25 DIAGNOSIS — M6281 Muscle weakness (generalized): Secondary | ICD-10-CM | POA: Diagnosis not present

## 2018-12-25 DIAGNOSIS — M5416 Radiculopathy, lumbar region: Secondary | ICD-10-CM | POA: Diagnosis not present

## 2018-12-25 DIAGNOSIS — M545 Low back pain: Secondary | ICD-10-CM | POA: Diagnosis not present

## 2018-12-25 DIAGNOSIS — M5126 Other intervertebral disc displacement, lumbar region: Secondary | ICD-10-CM | POA: Diagnosis not present

## 2019-01-04 DIAGNOSIS — M5412 Radiculopathy, cervical region: Secondary | ICD-10-CM | POA: Diagnosis not present

## 2019-01-04 DIAGNOSIS — M5126 Other intervertebral disc displacement, lumbar region: Secondary | ICD-10-CM | POA: Diagnosis not present

## 2019-01-04 DIAGNOSIS — M542 Cervicalgia: Secondary | ICD-10-CM | POA: Diagnosis not present

## 2019-01-04 DIAGNOSIS — M545 Low back pain: Secondary | ICD-10-CM | POA: Diagnosis not present

## 2019-02-19 ENCOUNTER — Other Ambulatory Visit: Payer: Self-pay

## 2019-02-19 DIAGNOSIS — Z20822 Contact with and (suspected) exposure to covid-19: Secondary | ICD-10-CM

## 2019-02-20 LAB — NOVEL CORONAVIRUS, NAA: SARS-CoV-2, NAA: NOT DETECTED

## 2019-04-17 DIAGNOSIS — M545 Low back pain: Secondary | ICD-10-CM | POA: Diagnosis not present

## 2019-04-17 DIAGNOSIS — M542 Cervicalgia: Secondary | ICD-10-CM | POA: Diagnosis not present

## 2019-04-17 DIAGNOSIS — M5126 Other intervertebral disc displacement, lumbar region: Secondary | ICD-10-CM | POA: Diagnosis not present

## 2019-06-02 ENCOUNTER — Other Ambulatory Visit: Payer: Self-pay

## 2019-06-02 ENCOUNTER — Emergency Department (HOSPITAL_COMMUNITY)
Admission: EM | Admit: 2019-06-02 | Discharge: 2019-06-02 | Disposition: A | Discharge status: Home / Self Care | Payer: BC Managed Care – PPO | Attending: Emergency Medicine | Admitting: Emergency Medicine

## 2019-06-02 ENCOUNTER — Emergency Department (HOSPITAL_COMMUNITY): Payer: BC Managed Care – PPO

## 2019-06-02 DIAGNOSIS — S59911A Unspecified injury of right forearm, initial encounter: Secondary | ICD-10-CM | POA: Insufficient documentation

## 2019-06-02 DIAGNOSIS — M25531 Pain in right wrist: Secondary | ICD-10-CM | POA: Diagnosis not present

## 2019-06-02 DIAGNOSIS — M25561 Pain in right knee: Secondary | ICD-10-CM | POA: Insufficient documentation

## 2019-06-02 DIAGNOSIS — S8991XA Unspecified injury of right lower leg, initial encounter: Secondary | ICD-10-CM | POA: Diagnosis not present

## 2019-06-02 DIAGNOSIS — M79631 Pain in right forearm: Secondary | ICD-10-CM | POA: Insufficient documentation

## 2019-06-02 DIAGNOSIS — W010XXA Fall on same level from slipping, tripping and stumbling without subsequent striking against object, initial encounter: Secondary | ICD-10-CM | POA: Insufficient documentation

## 2019-06-02 DIAGNOSIS — Y990 Civilian activity done for income or pay: Secondary | ICD-10-CM | POA: Insufficient documentation

## 2019-06-02 DIAGNOSIS — S6991XA Unspecified injury of right wrist, hand and finger(s), initial encounter: Secondary | ICD-10-CM | POA: Diagnosis not present

## 2019-06-02 DIAGNOSIS — S63501A Unspecified sprain of right wrist, initial encounter: Secondary | ICD-10-CM | POA: Insufficient documentation

## 2019-06-02 DIAGNOSIS — Y9389 Activity, other specified: Secondary | ICD-10-CM | POA: Insufficient documentation

## 2019-06-02 DIAGNOSIS — W19XXXA Unspecified fall, initial encounter: Secondary | ICD-10-CM

## 2019-06-02 DIAGNOSIS — Y929 Unspecified place or not applicable: Secondary | ICD-10-CM | POA: Insufficient documentation

## 2019-06-02 MED ORDER — HYDROCODONE-ACETAMINOPHEN 5-325 MG PO TABS: 1.0000 | ORAL_TABLET | Freq: Four times a day (QID) | ORAL | 0 refills | Status: DC | PRN

## 2019-06-02 MED ORDER — HYDROCODONE-ACETAMINOPHEN 5-325 MG PO TABS
1.0000 | ORAL_TABLET | Freq: Once | ORAL | Status: AC
  Administered 2019-06-02: 19:00:00 1 via ORAL
  Filled 2019-06-02: qty 1

## 2019-06-02 NOTE — Discharge Instructions (Signed)
As we discussed, your x-rays today were reassuring.  Based on your tenderness on your wrist, you will need to follow-up with a orthopedic hand doctor to ensure that there is no scaphoid fracture.  Wear the splint for support and stabilization.  You can apply ice to help with pain and swelling.  You can take Tylenol or Ibuprofen as directed for pain. You can alternate Tylenol and Ibuprofen every 4 hours. If you take Tylenol at 1pm, then you can take Ibuprofen at 5pm. Then you can take Tylenol again at 9pm.   Take pain medications as directed for break through pain. Do not drive or operate machinery while taking this medication.   Return the emergency department for any worsening pain, swelling, numbness/weakness, discoloration.

## 2019-06-02 NOTE — ED Notes (Signed)
Discharge instructions discussed with pt. Pt verbalized understanding with no questions at this time. Pt to go home with friend 

## 2019-06-02 NOTE — ED Notes (Signed)
Wrist splint applied to right wrist.

## 2019-06-02 NOTE — ED Triage Notes (Signed)
Patient complains of right wrist and right knee pain after falling at work today, states that she has pain radiating to forearm, NAD

## 2019-06-02 NOTE — ED Provider Notes (Signed)
[facility] Provider Note   CSN: [id number] Arrival date & time: 06/02/19  1636     History Chief Complaint  Patient presents with  . Fall  . Wrist Pain  . Knee Pain    Jessica Richardson is a 49 y.o. female who presents for evaluation of right forearm, wrist pain, knee pain after mechanical fall that occurred this afternoon while at work.  Patient reports that there was something plastic on the floor that she tripped over and fell with her arm outstretched.  She thinks that she landed on the palmar aspect of her hand but she is unsure.  No head injury, LOC.  Since then, she has had pain to the right knee, right wrist, forearm.  She states she has been able to ambulate and bear weight on the right knee.  She has a history of previous surgery on the right knee.  She has not taken anything for the pain.  She denies any numbness/weakness.  The history is provided by the patient.       Past Medical History:  Diagnosis Date  . Arthritis   . Depression   . Diabetes mellitus    diet conttrolled  only,no meds ever  . Hip arthritis, left, post-SCFE 03/29/2011    Patient Active Problem List   Diagnosis Date Noted  . Closed fracture of right fibula and tibia 05/13/2012  . 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 Hypovolemia 05/13/2012  . Hip arthritis, left, post-SCFE, total hip Jan 2013 by Dr Mardelle Matte 03/29/2011    Past Surgical History:  Procedure Laterality Date  . ABDOMINAL HYSTERECTOMY    . APPENDECTOMY    . CESAREAN SECTION    . JOINT REPLACEMENT     pins both hips  . TIBIA IM NAIL INSERTION Right 05/15/2012   Procedure: Open Reduction Internal Fixation Right Tibia;  Surgeon: Johnny Bridge, MD;  Location: [facility];  Service: Orthopedics;  Laterality: Right;  . TONSILLECTOMY    . TOTAL HIP ARTHROPLASTY  03/29/2011   Procedure: TOTAL HIP ARTHROPLASTY;  Surgeon: Johnny Bridge;  Location: [facility];  Service:  Orthopedics;  Laterality: Left;  with removal of retained hardware (knowles pins)     OB History   No obstetric history on file.     No family history on file.  Social History   Tobacco Use  . Smoking status: Current Every Day Smoker    Types: Cigars    Last attempt to quit: 03/24/2011    Years since quitting: 8.1  . Smokeless tobacco: Never Used  Substance Use Topics  . Alcohol use: No    Comment: rarely  . Drug use: No    Home Medications Prior to Admission medications   Medication Sig Start Date End Date Taking? Authorizing Provider  HYDROcodone-acetaminophen (NORCO/VICODIN) 5-325 MG tablet Take 1-2 tablets by mouth every 6 (six) hours as needed. 06/02/19   Volanda Napoleon, PA-C  ibuprofen (ADVIL,MOTRIN) 600 MG tablet Take 1 tablet (600 mg total) by mouth every 6 (six) hours as needed. Patient taking differently: Take 600 mg by mouth every 6 (six) hours as needed for moderate pain.  07/01/18   Charlann Lange, PA-C  naproxen (NAPROSYN) 500 MG tablet Take 1 tablet (500 mg total) by mouth 2 (two) times daily. Patient not taking: Reported on 07/03/2018 03/18/18   Blanchie Dessert, MD    Allergies    Penicillins  Review of Systems   Review  of Systems  Musculoskeletal:       Right wrist pain Right forearm pain Right knee pain  Neurological: Negative for weakness and numbness.  All other systems reviewed and are negative.   Physical Exam Updated Vital Signs BP (!) 110/94   Pulse 78   Temp 98.2 F (36.8 C) (Oral)   Resp 19   LMP 07/17/2002 (Approximate)   SpO2 96%   Physical Exam Vitals and nursing note reviewed.  Constitutional:      Appearance: She is well-developed.  HENT:     Head: Normocephalic and atraumatic.  Eyes:     General: No scleral icterus.       Right eye: No discharge.        Left eye: No discharge.     Conjunctiva/sclera: Conjunctivae normal.  Cardiovascular:     Pulses:          Radial pulses are 2+ on the right side and 2+ on the left  side.       Dorsalis pedis pulses are 2+ on the right side and 2+ on the left side.  Pulmonary:     Effort: Pulmonary effort is normal.  Musculoskeletal:     Comments: Tenderness to palpation noted to the volar aspect of the wrist with some overlying soft tissue swelling. +Snuffbox tenderness noted to the radial aspect of wrist. Mild tenderness noted to the ulnar aspect of the right forearm. Flexion/extension of the right wrist intact but with pain.  She can wiggle all 5 digits with any difficulty.  No tenderness palpation of the right elbow, right shoulder.  No tenderness palpation of the left upper extremity.  Tenderness palpation of the anterior and lateral aspect of the right knee.  There is an overlying well-healed surgical incision scar noted.  No deformity or crepitus noted.  Flexion/tension intact by difficulty.  Negative anterior and posterior drawer test.  No laxity noted with varus and valgus stress.  No tenderness palpation of the proximal tib-fib, distal tib-fib, ankle.  No tenderness palpation of left  lower extremity.  Full range of motion of left lower extremity intact any difficulty.  Skin:    General: Skin is warm and dry.     Comments: Good distal cap refill.  RUE is not dusky in appearance or cool to touch.  Neurological:     Mental Status: She is alert.  Psychiatric:        Speech: Speech normal.        Behavior: Behavior normal.     ED Results / Procedures / Treatments   Labs (all labs ordered are listed, but only abnormal results are displayed) Labs Reviewed - No data to display  EKG None  Radiology DG Forearm Right  Result Date: 06/02/2019 CLINICAL DATA:  Fall, pain EXAM: RIGHT WRIST - COMPLETE 3+ VIEW; RIGHT FOREARM - 2 VIEW COMPARISON:  None. FINDINGS: There is no evidence of fracture or dislocation. There is no evidence of arthropathy or other focal bone abnormality. Soft tissues are unremarkable. IMPRESSION: No fracture or dislocation of the right wrist or  forearm. Electronically Signed   By: Eddie Candle M.D.   On: 06/02/2019 17:42   DG Wrist Complete Right  Result Date: 06/02/2019 CLINICAL DATA:  Fall, pain EXAM: RIGHT WRIST - COMPLETE 3+ VIEW; RIGHT FOREARM - 2 VIEW COMPARISON:  None. FINDINGS: There is no evidence of fracture or dislocation. There is no evidence of arthropathy or other focal bone abnormality. Soft tissues are unremarkable. IMPRESSION: No fracture or dislocation  of the right wrist or forearm. Electronically Signed   By: Eddie Candle M.D.   On: 06/02/2019 17:42   DG Knee Complete 4 Views Right  Result Date: 06/02/2019 CLINICAL DATA:  Fall EXAM: RIGHT KNEE - COMPLETE 4+ VIEW COMPARISON:  07/26/2012 FINDINGS: No acute fracture or dislocation of the right knee. Redemonstrated plate and screw fixation of the lateral right tibial plateau and proximal metadiaphysis. Chronic fracture deformity of the right fibular neck. Moderate tricompartmental arthrosis. Soft tissues are unremarkable. IMPRESSION: 1. No acute fracture or dislocation of the right knee. 2. Redemonstrated plate and screw fixation of the lateral right tibial plateau and proximal metadiaphysis. Chronic fracture deformity of the right fibular neck. Electronically Signed   By: Eddie Candle M.D.   On: 06/02/2019 17:44    Procedures Procedures (including critical care time)  Medications Ordered in ED Medications  HYDROcodone-acetaminophen (NORCO/VICODIN) 5-325 MG per tablet 1 tablet (1 tablet Oral Given 06/02/19 1837)    ED Course  I have reviewed the triage vital signs and the nursing notes.  Pertinent labs & imaging results that were available during my care of the patient were reviewed by me and considered in my medical decision making (see chart for details).    MDM Rules/Calculators/A&P                      49 year old female who presents for evaluation of right forearm, right wrist pain, right knee pain after mechanical fall that occurred at work.  No head injury,  LOC.  On initially arrival, she is afebrile, nontoxic-appearing.  Vital signs are stable.  She is neurovascularly intact.  On exam, she has pain, swelling noted to the dorsal aspect of her right wrist.  She has positive snuffbox tenderness.  No deformity or crepitus noted.  Additionally, she has diffuse tenderness into the ulnar aspect of the forearm as well as the anterior aspect of the right knee.  Concern for fracture versus sprain.  Plan for x-rays.  X-ray of knee shows no acute fracture dislocation.  There is redemonstrated plate and screw fixation of the lateral right tibial plateau and proximal metadiaphysis.  There is chronic fracture deformity noted.  X-ray of wrist and forearm negative for any acute bony abnormality.  I discussed results with patient.  Given that she has anatomic snuffbox tenderness, concern for scaphoid injury.  She has previously followed with [facility].  We will plan to put her in a thumb spica splint here in the ED and have her follow-up with Ortho as directed.  Patient reviewed on PMP.  Will give short course of pain medication for acute pain. At this time, patient exhibits no emergent life-threatening condition that require further evaluation in ED or admission. Patient had ample opportunity for questions and discussion. All patient's questions were answered with full understanding. Strict return precautions discussed. Patient expresses understanding and agreement to plan.   Portions of this note were generated with [employer]. Dictation errors may occur despite best attempts at proofreading.   Final Clinical Impression(s) / ED Diagnoses Final diagnoses:  Fall, initial encounter  Acute pain of right knee  Sprain of right wrist, initial encounter    Rx / DC Orders ED Discharge Orders         Ordered    HYDROcodone-acetaminophen (NORCO/VICODIN) 5-325 MG tablet  Every 6 hours PRN     06/02/19 1849           Volanda Napoleon, PA-C 06/02/19  2312    Maudie Flakes, MD 06/06/19 2355

## 2019-08-23 DIAGNOSIS — M5126 Other intervertebral disc displacement, lumbar region: Secondary | ICD-10-CM | POA: Diagnosis not present

## 2019-08-23 DIAGNOSIS — M545 Low back pain: Secondary | ICD-10-CM | POA: Diagnosis not present

## 2019-11-06 ENCOUNTER — Ambulatory Visit: Payer: BC Managed Care – PPO | Admitting: Internal Medicine

## 2020-09-19 LAB — HM HEPATITIS C SCREENING LAB: HM Hepatitis Screen: NEGATIVE
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
## 2021-10-08 ENCOUNTER — Other Ambulatory Visit (HOSPITAL_COMMUNITY): Payer: Self-pay | Admitting: Orthopedic Surgery

## 2021-10-08 DIAGNOSIS — M25561 Pain in right knee: Secondary | ICD-10-CM | POA: Diagnosis not present

## 2021-10-08 DIAGNOSIS — M79604 Pain in right leg: Secondary | ICD-10-CM

## 2021-10-08 DIAGNOSIS — M79661 Pain in right lower leg: Secondary | ICD-10-CM | POA: Diagnosis not present

## 2021-10-09 ENCOUNTER — Ambulatory Visit (HOSPITAL_COMMUNITY)
Admission: RE | Admit: 2021-10-09 | Discharge: 2021-10-09 | Disposition: A | Discharge status: Home / Self Care | Payer: 59 | Source: Ambulatory Visit | Attending: Orthopedic Surgery | Admitting: Orthopedic Surgery

## 2021-10-09 DIAGNOSIS — M79604 Pain in right leg: Secondary | ICD-10-CM | POA: Diagnosis not present

## 2021-10-09 DIAGNOSIS — M7989 Other specified soft tissue disorders: Secondary | ICD-10-CM | POA: Diagnosis not present

## 2021-10-22 ENCOUNTER — Encounter: Payer: Self-pay | Admitting: Nurse Practitioner

## 2021-10-22 ENCOUNTER — Ambulatory Visit (INDEPENDENT_AMBULATORY_CARE_PROVIDER_SITE_OTHER): Payer: 59 | Admitting: Nurse Practitioner

## 2021-10-22 VITALS — BP 145/87 | HR 88 | Ht 64.0 in | Wt 291.6 lb

## 2021-10-22 DIAGNOSIS — M7989 Other specified soft tissue disorders: Secondary | ICD-10-CM

## 2021-10-22 DIAGNOSIS — F17200 Nicotine dependence, unspecified, uncomplicated: Secondary | ICD-10-CM

## 2021-10-22 DIAGNOSIS — Z0000 Encounter for general adult medical examination without abnormal findings: Secondary | ICD-10-CM | POA: Diagnosis not present

## 2021-10-22 DIAGNOSIS — R030 Elevated blood-pressure reading, without diagnosis of hypertension: Secondary | ICD-10-CM | POA: Diagnosis not present

## 2021-10-22 DIAGNOSIS — Z124 Encounter for screening for malignant neoplasm of cervix: Secondary | ICD-10-CM

## 2021-10-22 DIAGNOSIS — Z1231 Encounter for screening mammogram for malignant neoplasm of breast: Secondary | ICD-10-CM

## 2021-10-22 DIAGNOSIS — Z23 Encounter for immunization: Secondary | ICD-10-CM | POA: Diagnosis not present

## 2021-10-22 DIAGNOSIS — Z1211 Encounter for screening for malignant neoplasm of colon: Secondary | ICD-10-CM | POA: Insufficient documentation

## 2021-10-22 MED ORDER — HYDROCHLOROTHIAZIDE 25 MG PO TABS: 25.0000 mg | ORAL_TABLET | Freq: Every day | ORAL | 3 refills | Status: DC

## 2021-10-22 NOTE — Progress Notes (Unsigned)
Established Patient Office Visit  Subjective:  Patient ID: Jessica Richardson, female    DOB: 1971/02/26  Age: 51 y.o. MRN: [id number]  CC:  Chief Complaint  Patient presents with   Annual Exam     HPI  Jessica Richardson presents for re-establish care and annual physical exam.She is not taking any medication at present. She has h/o diabetes diet controlled, arthrihits.   Flu: no Tetanus: no COVID: Moderna Pap smear:3-4 years Dentist: last year  Eye examination: coming up Exercise: walk for 20 min  Diet: Patient does eat meat(Chicken, fish, [place]). Patient consumes fruits and veggies. Patient eat fried food twice a week. Patient drinks water and green tea.   She smokes one cigratte a day trying to quit. She consume alcohol once or twice a month.    HPI   Past Medical History:  Diagnosis Date   Arthritis    Depression    Diabetes mellitus    diet conttrolled  only,no meds ever   Hip arthritis, left, post-SCFE 03/29/2011    Past Surgical History:  Procedure Laterality Date   ABDOMINAL HYSTERECTOMY     APPENDECTOMY     CESAREAN SECTION     JOINT REPLACEMENT     pins both hips   TIBIA IM NAIL INSERTION Right 05/15/2012   Procedure: Open Reduction Internal Fixation Right Tibia;  Surgeon: Johnny Bridge, MD;  Location: [facility];  Service: Orthopedics;  Laterality: Right;   TONSILLECTOMY     TOTAL HIP ARTHROPLASTY  03/29/2011   Procedure: TOTAL HIP ARTHROPLASTY;  Surgeon: Johnny Bridge;  Location: [facility];  Service: Orthopedics;  Laterality: Left;  with removal of retained hardware (knowles pins)    History reviewed. No pertinent family history.  Social History   Socioeconomic History   Marital status: Single    Spouse name: Not on file   Number of children: Not on file   Years of education: Not on file   Highest education level: Not on file  Occupational History   Not on file  Tobacco Use   Smoking status: Every Day    Types: Cigars    Last attempt to quit:  03/24/2011    Years since quitting: 10.5   Smokeless tobacco: Never  Substance and Sexual Activity   Alcohol use: No    Comment: rarely   Drug use: No   Sexual activity: Never  Other Topics Concern   Not on file  Social History Narrative   Not on file   Social Determinants of Health   Financial Resource Strain: Not on file  Food Insecurity: Not on file  Transportation Needs: Not on file  Physical Activity: Not on file  Stress: Not on file  Social Connections: Not on file  Intimate Partner Violence: Not on file     Outpatient Medications Prior to Visit  Medication Sig Dispense Refill   ibuprofen (ADVIL,MOTRIN) 600 MG tablet Take 1 tablet (600 mg total) by mouth every 6 (six) hours as needed. (Patient taking differently: Take 600 mg by mouth every 6 (six) hours as needed for moderate pain. ) 30 tablet 0   HYDROcodone-acetaminophen (NORCO/VICODIN) 5-325 MG tablet Take 1-2 tablets by mouth every 6 (six) hours as needed. 6 tablet 0   naproxen (NAPROSYN) 500 MG tablet Take 1 tablet (500 mg total) by mouth 2 (two) times daily. (Patient not taking: Reported on 07/03/2018) 20 tablet 0   No facility-administered medications prior to visit.    Allergies  Allergen Reactions  Penicillins Hives    ROS Review of Systems  Constitutional:  Negative for activity change and diaphoresis.  HENT:  Negative for congestion, ear pain, rhinorrhea and tinnitus.   Eyes:  Negative for discharge and redness.  Respiratory:  Negative for apnea, shortness of breath and wheezing.   Cardiovascular:  Positive for leg swelling. Negative for chest pain and palpitations.  Gastrointestinal:  Negative for abdominal distention, blood in stool and rectal pain.  Genitourinary:  Negative for difficulty urinating and frequency.  Musculoskeletal:  Positive for back pain. Negative for arthralgias and neck pain.  Skin:  Negative for color change and rash.  Neurological:  Negative for dizziness, facial asymmetry and  headaches.  Psychiatric/Behavioral:  Negative for agitation, behavioral problems and confusion.       Objective:    Physical Exam Exam conducted with a chaperone present.  Constitutional:      Appearance: Normal appearance. She is obese.  HENT:     Head: Normocephalic.     Right Ear: Tympanic membrane normal.     Left Ear: Tympanic membrane normal.     Nose: Nose normal.     Mouth/Throat:     Mouth: Mucous membranes are moist.     Pharynx: Oropharynx is clear.  Eyes:     Extraocular Movements: Extraocular movements intact.     Conjunctiva/sclera: Conjunctivae normal.     Pupils: Pupils are equal, round, and reactive to light.  Cardiovascular:     Rate and Rhythm: Normal rate and regular rhythm.     Pulses: Normal pulses.  Pulmonary:     Effort: Pulmonary effort is normal. No respiratory distress.     Breath sounds: No rhonchi.  Abdominal:     General: Bowel sounds are normal.     Palpations: Abdomen is soft. There is no mass.     Tenderness: There is no abdominal tenderness.     Hernia: No hernia is present.  Genitourinary:    Vagina: Normal. No signs of injury.     Cervix: Normal.  Musculoskeletal:        General: No swelling or tenderness.  Skin:    Capillary Refill: Capillary refill takes less than 2 seconds.     Coloration: Skin is not jaundiced.     Findings: No erythema.  Neurological:     General: No focal deficit present.     Mental Status: She is alert and oriented to person, place, and time. Mental status is at baseline.  Psychiatric:        Mood and Affect: Mood normal.        Behavior: Behavior normal.        Thought Content: Thought content normal.        Judgment: Judgment normal.     BP (!) 145/87   Pulse 88   Ht '5\' 4"'$  (1.626 m)   Wt 291 lb 9.6 oz (132.3 kg)   LMP 07/17/2002 (Approximate)   BMI 50.05 kg/m  Wt Readings from Last 3 Encounters:  10/22/21 291 lb 9.6 oz (132.3 kg)  07/03/18 (!) 304 lb (137.9 kg)  09/17/16 (!) 310 lb (140.6 kg)      Health Maintenance Due  Topic Date Due   COVID-19 Vaccine (1) Never done   URINE MICROALBUMIN  Never done   HIV Screening  Never done   COLONOSCOPY (Pts 45-39yr Insurance coverage will need to be confirmed)  Never done   MAMMOGRAM  Never done   Zoster Vaccines- Shingrix (1 of 2) Never done  INFLUENZA VACCINE  10/20/2021    There are no preventive care reminders to display for this patient.  No results found for: "TSH" Lab Results  Component Value Date   WBC 7.1 05/15/2012   HGB 10.4 (L) 05/15/2012   HCT 31.3 (L) 05/15/2012   MCV 89.9 05/15/2012   PLT 221 05/15/2012   Lab Results  Component Value Date   NA 138 05/13/2012   K 3.7 05/13/2012   CO2 27 05/13/2012   GLUCOSE 137 (H) 05/13/2012   BUN 13 05/13/2012   CREATININE 0.63 05/15/2012   BILITOT 1.0 05/13/2012   ALKPHOS 126 (H) 05/13/2012   AST 15 05/13/2012   ALT 16 05/13/2012   PROT 7.0 05/13/2012   ALBUMIN 3.0 (L) 05/13/2012   CALCIUM 8.5 05/13/2012   No results found for: "CHOL" No results found for: "HDL" No results found for: "LDLCALC" No results found for: "TRIG" No results found for: "CHOLHDL" No results found for: "HGBA1C"    Assessment & Plan:   Problem List Items Addressed This Visit       Other   Annual physical exam - Primary    Encouraged patient to consume a balanced diet and follow regular exercise regimen. Advised to see an eye doctor and dentist annually.  Tdap vaccine administered IM. Pap smear ordered. Mammogram and colon screening order.       Leg swelling   Morbid obesity ([place])   Other Visit Diagnoses     Encounter for screening mammogram for malignant neoplasm of breast       Relevant Orders   MM 3D SCREEN BREAST BILATERAL   Screening for colon cancer       Relevant Orders   Ambulatory referral to Gastroenterology   Screening for cervical cancer       Relevant Orders   Wet prep, genital   Need for Tdap vaccination       Relevant Orders   Tdap vaccine greater  than or equal to 7yo IM (Completed)        Meds ordered this encounter  Medications   hydrochlorothiazide (HYDRODIURIL) 25 MG tablet    Sig: Take 1 tablet (25 mg total) by mouth daily.    Dispense:  90 tablet    Refill:  3     Follow-up: No follow-ups on file.    Theresia Lo, NP

## 2021-10-23 ENCOUNTER — Ambulatory Visit: Payer: 59

## 2021-10-23 DIAGNOSIS — Z0000 Encounter for general adult medical examination without abnormal findings: Secondary | ICD-10-CM | POA: Diagnosis not present

## 2021-10-23 NOTE — Assessment & Plan Note (Signed)
Encouraged patient to consume a balanced diet and follow regular exercise regimen. Advised to see an eye doctor and dentist annually.  Tdap vaccine administered IM. Pap smear ordered. Mammogram and colon screening order.

## 2021-10-24 DIAGNOSIS — F17200 Nicotine dependence, unspecified, uncomplicated: Secondary | ICD-10-CM | POA: Insufficient documentation

## 2021-10-24 DIAGNOSIS — R030 Elevated blood-pressure reading, without diagnosis of hypertension: Secondary | ICD-10-CM | POA: Insufficient documentation

## 2021-10-24 LAB — COMPLETE METABOLIC PANEL WITH GFR
AG Ratio: 1.1 (calc) (ref 1.0–2.5)
ALT: 11 U/L (ref 6–29)
AST: 13 U/L (ref 10–35)
Albumin: 3.6 g/dL (ref 3.6–5.1)
Alkaline phosphatase (APISO): 139 U/L (ref 37–153)
BUN: 19 mg/dL (ref 7–25)
CO2: 22 mmol/L (ref 20–32)
Calcium: 8.9 mg/dL (ref 8.6–10.4)
Chloride: 105 mmol/L (ref 98–110)
Creat: 0.95 mg/dL (ref 0.50–1.03)
Globulin: 3.3 g/dL (calc) (ref 1.9–3.7)
Glucose, Bld: 323 mg/dL — ABNORMAL HIGH (ref 65–99)
Potassium: 4.5 mmol/L (ref 3.5–5.3)
Sodium: 138 mmol/L (ref 135–146)
Total Bilirubin: 1.1 mg/dL (ref 0.2–1.2)
Total Protein: 6.9 g/dL (ref 6.1–8.1)
eGFR: 73 mL/min/{1.73_m2} (ref >=60)

## 2021-10-24 LAB — CBC WITH DIFFERENTIAL/PLATELET
Absolute Monocytes: 292 cells/uL (ref 200–950)
Basophils Absolute: 42 cells/uL (ref 0–200)
Basophils Relative: 0.8 %
Eosinophils Absolute: 212 cells/uL (ref 15–500)
Eosinophils Relative: 4 %
HCT: 38.3 % (ref 35.0–45.0)
Hemoglobin: 12.7 g/dL (ref 11.7–15.5)
Lymphs Abs: 2231 cells/uL (ref 850–3900)
MCH: 30.7 pg (ref 27.0–33.0)
MCHC: 33.2 g/dL (ref 32.0–36.0)
MCV: 92.5 fL (ref 80.0–100.0)
MPV: 11 fL (ref 7.5–12.5)
Monocytes Relative: 5.5 %
Neutro Abs: 2523 cells/uL (ref 1500–7800)
Neutrophils Relative %: 47.6 %
Platelets: 236 10*3/uL (ref 140–400)
RBC: 4.14 10*6/uL (ref 3.80–5.10)
RDW: 12.6 % (ref 11.0–15.0)
Total Lymphocyte: 42.1 %
WBC: 5.3 10*3/uL (ref 3.8–10.8)

## 2021-10-24 LAB — HEMOGLOBIN A1C
Hgb A1c MFr Bld: 11.9 % of total Hgb — ABNORMAL HIGH (ref <5.7)
Mean Plasma Glucose: 295 mg/dL
eAG (mmol/L): 16.3 mmol/L

## 2021-10-24 LAB — LIPID PANEL
Cholesterol: 256 mg/dL — ABNORMAL HIGH (ref <200)
HDL: 58 mg/dL (ref >=50)
LDL Cholesterol (Calc): 171 mg/dL (calc) — ABNORMAL HIGH
Non-HDL Cholesterol (Calc): 198 mg/dL (calc) — ABNORMAL HIGH (ref <130)
Total CHOL/HDL Ratio: 4.4 (calc) (ref <5.0)
Triglycerides: 132 mg/dL (ref <150)

## 2021-10-24 LAB — TSH: TSH: 1.32 mIU/L

## 2021-10-24 NOTE — Assessment & Plan Note (Signed)
Blood pressure elevated in the office today. Advised patient to check blood pressure at home and bring the readings at next visit. We will continue to monitor.

## 2021-10-24 NOTE — Assessment & Plan Note (Signed)
Body mass index is 50.05 kg/m. Advised pt to lose weight. Advised patient to avoid trans fat, fatty and fried food. Follow a regular physical activity schedule. Went over the risk of chronic diseases with increased weight.

## 2021-10-24 NOTE — Assessment & Plan Note (Signed)
Smoking cessation was discussed, 5-7 minutes was spent of this topic specifically.   

## 2021-10-24 NOTE — Patient Instructions (Signed)
Health Maintenance, Female Adopting a healthy lifestyle and getting preventive care are important in promoting health and wellness. Ask your health care provider about: The right schedule for you to have regular tests and exams. Things you can do on your own to prevent diseases and keep yourself healthy. What should I know about diet, weight, and exercise? Eat a healthy diet  Eat a diet that includes plenty of vegetables, fruits, low-fat dairy products, and lean protein. Do not eat a lot of foods that are high in solid fats, added sugars, or sodium. Maintain a healthy weight Body mass index (BMI) is used to identify weight problems. It estimates body fat based on height and weight. Your health care provider can help determine your BMI and help you achieve or maintain a healthy weight. Get regular exercise Get regular exercise. This is one of the most important things you can do for your health. Most adults should: Exercise for at least 150 minutes each week. The exercise should increase your heart rate and make you sweat (moderate-intensity exercise). Do strengthening exercises at least twice a week. This is in addition to the moderate-intensity exercise. Spend less time sitting. Even light physical activity can be beneficial. Watch cholesterol and blood lipids Have your blood tested for lipids and cholesterol at 51 years of age, then have this test every 5 years. Have your cholesterol levels checked more often if: Your lipid or cholesterol levels are high. You are older than 51 years of age. You are at high risk for heart disease. What should I know about cancer screening? Depending on your health history and family history, you may need to have cancer screening at various ages. This may include screening for: Breast cancer. Cervical cancer. Colorectal cancer. Skin cancer. Lung cancer. What should I know about heart disease, diabetes, and high blood pressure? Blood pressure and heart  disease High blood pressure causes heart disease and increases the risk of stroke. This is more likely to develop in people who have high blood pressure readings or are overweight. Have your blood pressure checked: Every 3-5 years if you are 18-39 years of age. Every year if you are 40 years old or older. Diabetes Have regular diabetes screenings. This checks your fasting blood sugar level. Have the screening done: Once every three years after age 40 if you are at a normal weight and have a low risk for diabetes. More often and at a younger age if you are overweight or have a high risk for diabetes. What should I know about preventing infection? Hepatitis B If you have a higher risk for hepatitis B, you should be screened for this virus. Talk with your health care provider to find out if you are at risk for hepatitis B infection. Hepatitis C Testing is recommended for: Everyone born from 1945 through 1965. Anyone with known risk factors for hepatitis C. Sexually transmitted infections (STIs) Get screened for STIs, including gonorrhea and chlamydia, if: You are sexually active and are younger than 51 years of age. You are older than 51 years of age and your health care provider tells you that you are at risk for this type of infection. Your sexual activity has changed since you were last screened, and you are at increased risk for chlamydia or gonorrhea. Ask your health care provider if you are at risk. Ask your health care provider about whether you are at high risk for HIV. Your health care provider may recommend a prescription medicine to help prevent HIV   infection. If you choose to take medicine to prevent HIV, you should first get tested for HIV. You should then be tested every 3 months for as long as you are taking the medicine. Pregnancy If you are about to stop having your period (premenopausal) and you may become pregnant, seek counseling before you get pregnant. Take 400 to 800  micrograms (mcg) of folic acid every day if you become pregnant. Ask for birth control (contraception) if you want to prevent pregnancy. Osteoporosis and menopause Osteoporosis is a disease in which the bones lose minerals and strength with aging. This can result in bone fractures. If you are 65 years old or older, or if you are at risk for osteoporosis and fractures, ask your health care provider if you should: Be screened for bone loss. Take a calcium or vitamin D supplement to lower your risk of fractures. Be given hormone replacement therapy (HRT) to treat symptoms of menopause. Follow these instructions at home: Alcohol use Do not drink alcohol if: Your health care provider tells you not to drink. You are pregnant, may be pregnant, or are planning to become pregnant. If you drink alcohol: Limit how much you have to: 0-1 drink a day. Know how much alcohol is in your drink. In the [place] one drink equals one 12 oz bottle of beer (355 mL), one 5 oz glass of wine (148 mL), or one 1 oz glass of hard liquor (44 mL). Lifestyle Do not use any products that contain nicotine or tobacco. These products include cigarettes, chewing tobacco, and vaping devices, such as e-cigarettes. If you need help quitting, ask your health care provider. Do not use street drugs. Do not share needles. Ask your health care provider for help if you need support or information about quitting drugs. General instructions Schedule regular health, dental, and eye exams. Stay current with your vaccines. Tell your health care provider if: You often feel depressed. You have ever been abused or do not feel safe at home. Summary Adopting a healthy lifestyle and getting preventive care are important in promoting health and wellness. Follow your health care provider's instructions about healthy diet, exercising, and getting tested or screened for diseases. Follow your health care provider's instructions on monitoring your  cholesterol and blood pressure. This information is not intended to replace advice given to you by your health care provider. Make sure you discuss any questions you have with your health care provider. Document Revised: 07/28/2020 Document Reviewed: 07/28/2020 Elsevier Patient Education  2023 [employer] Inc.     Why follow it? Research shows. Those who follow the Mediterranean diet have a reduced risk of heart disease  The diet is associated with a reduced incidence of Parkinson's and Alzheimer's diseases People following the diet may have longer life expectancies and lower rates of chronic diseases  The Dietary Guidelines for Americans recommends the Mediterranean diet as an eating plan to promote health and prevent disease  What Is the Mediterranean Diet?  Healthy eating plan based on typical foods and recipes of Mediterranean-style cooking The diet is primarily a plant based diet; these foods should make up a majority of meals   Starches - Plant based foods should make up a majority of meals - They are an important sources of vitamins, minerals, energy, antioxidants, and fiber - Choose whole grains, foods high in fiber and minimally processed items  - Typical grain sources include wheat, oats, barley, corn, brown rice, bulgar, farro, millet, polenta, couscous  - Various types of beans   include chickpeas, lentils, fava beans, black beans, white beans   Fruits  Veggies - Large quantities of antioxidant rich fruits & veggies; 6 or more servings  - Vegetables can be eaten raw or lightly drizzled with oil and cooked  - Vegetables common to the traditional Mediterranean Diet include: artichokes, arugula, beets, broccoli, brussel sprouts, cabbage, carrots, celery, collard greens, cucumbers, eggplant, kale, leeks, lemons, lettuce, mushrooms, okra, onions, peas, peppers, potatoes, pumpkin, radishes, rutabaga, shallots, spinach, sweet potatoes, turnips, zucchini - Fruits common to the Mediterranean  Diet include: apples, apricots, avocados, cherries, clementines, dates, figs, grapefruits, grapes, melons, nectarines, oranges, peaches, pears, pomegranates, strawberries, tangerines  Fats - Replace butter and margarine with healthy oils, such as olive oil, canola oil, and tahini  - Limit nuts to no more than a handful a day  - Nuts include walnuts, almonds, pecans, pistachios, pine nuts  - Limit or avoid candied, honey roasted or heavily salted nuts - Olives are central to the Mediterranean diet - can be eaten whole or used in a variety of dishes   Meats Protein - Limiting red meat: no more than a few times a month - When eating red meat: choose lean cuts and keep the portion to the size of deck of cards - Eggs: approx. 0 to 4 times a week  - Fish and lean poultry: at least 2 a week  - Healthy protein sources include, chicken, turkey, lean beef, lamb - Increase intake of seafood such as tuna, salmon, trout, mackerel, shrimp, scallops - Avoid or limit high fat processed meats such as sausage and bacon  Dairy - Include moderate amounts of low fat dairy products  - Focus on healthy dairy such as fat free yogurt, skim milk, low or reduced fat cheese - Limit dairy products higher in fat such as whole or 2% milk, cheese, ice cream  Alcohol - Moderate amounts of red wine is ok  - No more than 5 oz daily for women (all ages) and men older than age 65  - No more than 10 oz of wine daily for men younger than 65  Other - Limit sweets and other desserts  - Use herbs and spices instead of salt to flavor foods  - Herbs and spices common to the traditional Mediterranean Diet include: basil, bay leaves, chives, cloves, cumin, fennel, garlic, lavender, marjoram, mint, oregano, parsley, pepper, rosemary, sage, savory, sumac, tarragon, thyme   It's not just a diet, it's a lifestyle:  The Mediterranean diet includes lifestyle factors typical of those in the region  Foods, drinks and meals are best eaten with  others and savored Daily physical activity is important for overall good health This could be strenuous exercise like running and aerobics This could also be more leisurely activities such as walking, housework, yard-work, or taking the stairs Moderation is the key; a balanced and healthy diet accommodates most foods and drinks Consider portion sizes and frequency of consumption of certain foods   Meal Ideas & Options:  Breakfast:  Whole wheat toast or whole wheat English muffins with peanut butter & hard boiled egg Steel cut oats topped with apples & cinnamon and skim milk  Fresh fruit: banana, strawberries, melon, berries, peaches  Smoothies: strawberries, bananas, greek yogurt, peanut butter Low fat greek yogurt with blueberries and granola  Egg white omelet with spinach and mushrooms Breakfast couscous: whole wheat couscous, apricots, skim milk, cranberries  Sandwiches:  Hummus and grilled vegetables (peppers, zucchini, squash) on whole wheat bread   Grilled   chicken on whole wheat pita with lettuce, tomatoes, cucumbers or tzatziki  Tuna salad on whole wheat bread: tuna salad made with greek yogurt, olives, red peppers, capers, green onions Garlic rosemary lamb pita: lamb sauted with garlic, rosemary, salt & pepper; add lettuce, cucumber, greek yogurt to pita - flavor with lemon juice and black pepper  Seafood:  Mediterranean grilled salmon, seasoned with garlic, basil, parsley, lemon juice and black pepper Shrimp, lemon, and spinach whole-grain pasta salad made with low fat greek yogurt  Seared scallops with lemon orzo  Seared tuna steaks seasoned salt, pepper, coriander topped with tomato mixture of olives, tomatoes, olive oil, minced garlic, parsley, green onions and cappers  Meats:  Herbed greek chicken salad with kalamata olives, cucumber, feta  Red bell peppers stuffed with spinach, bulgur, lean ground beef (or lentils) & topped with feta   Kebabs: skewers of chicken, tomatoes,  onions, zucchini, squash  Turkey burgers: made with red onions, mint, dill, lemon juice, feta cheese topped with roasted red peppers Vegetarian Cucumber salad: cucumbers, artichoke hearts, celery, red onion, feta cheese, tossed in olive oil & lemon juice  Hummus and whole grain pita points with a greek salad (lettuce, tomato, feta, olives, cucumbers, red onion) Lentil soup with celery, carrots made with vegetable broth, garlic, salt and pepper  Tabouli salad: parsley, bulgur, mint, scallions, cucumbers, tomato, radishes, lemon juice, olive oil, salt and pepper.      

## 2021-10-24 NOTE — Assessment & Plan Note (Signed)
Started her on hydrochlorothiazide 25 mg daily. Advised patient to keep the feet elevated.

## 2021-10-28 NOTE — Progress Notes (Signed)
Please call pt with the abnormal result and make a follow up appointment.

## 2021-11-05 ENCOUNTER — Telehealth: Payer: Self-pay

## 2021-11-05 ENCOUNTER — Ambulatory Visit (INDEPENDENT_AMBULATORY_CARE_PROVIDER_SITE_OTHER): Payer: 59 | Admitting: Nurse Practitioner

## 2021-11-05 ENCOUNTER — Other Ambulatory Visit: Payer: Self-pay

## 2021-11-05 ENCOUNTER — Encounter: Payer: Self-pay | Admitting: Nurse Practitioner

## 2021-11-05 VITALS — BP 132/80 | HR 68 | Ht 64.0 in

## 2021-11-05 DIAGNOSIS — E119 Type 2 diabetes mellitus without complications: Secondary | ICD-10-CM

## 2021-11-05 DIAGNOSIS — Z1211 Encounter for screening for malignant neoplasm of colon: Secondary | ICD-10-CM

## 2021-11-05 DIAGNOSIS — F17200 Nicotine dependence, unspecified, uncomplicated: Secondary | ICD-10-CM | POA: Diagnosis not present

## 2021-11-05 MED ORDER — BLOOD GLUCOSE METER KIT: PACK | 0 refills | Status: DC

## 2021-11-05 MED ORDER — OMEPRAZOLE 40 MG PO CPDR: 40.0000 mg | DELAYED_RELEASE_CAPSULE | Freq: Every day | ORAL | 3 refills | Status: DC

## 2021-11-05 MED ORDER — NA SULFATE-K SULFATE-MG SULF 17.5-3.13-1.6 GM/177ML PO SOLN: 354.0000 mL | Freq: Once | ORAL | 0 refills | Status: AC

## 2021-11-05 NOTE — Progress Notes (Signed)
Established Patient Office Visit  Subjective:  Patient ID: Jessica Richardson, female    DOB: 10-21-1970  Age: 51 y.o. MRN: [id number]  CC:  Chief Complaint  Patient presents with   lab results    HPI  Jessica Richardson presents for lab follow up.  She would like to get bariatric surgery.    HPI   Past Medical History:  Diagnosis Date   Arthritis    Depression    Diabetes mellitus    diet conttrolled  only,no meds ever   Hip arthritis, left, post-SCFE 03/29/2011    Past Surgical History:  Procedure Laterality Date   ABDOMINAL HYSTERECTOMY     APPENDECTOMY     CESAREAN SECTION     JOINT REPLACEMENT     pins both hips   TIBIA IM NAIL INSERTION Right 05/15/2012   Procedure: Open Reduction Internal Fixation Right Tibia;  Surgeon: Johnny Bridge, MD;  Location: [facility];  Service: Orthopedics;  Laterality: Right;   TONSILLECTOMY     TOTAL HIP ARTHROPLASTY  03/29/2011   Procedure: TOTAL HIP ARTHROPLASTY;  Surgeon: Johnny Bridge;  Location: [facility];  Service: Orthopedics;  Laterality: Left;  with removal of retained hardware (knowles pins)    History reviewed. No pertinent family history.  Social History   Socioeconomic History   Marital status: Single    Spouse name: Not on file   Number of children: Not on file   Years of education: Not on file   Highest education level: Not on file  Occupational History   Not on file  Tobacco Use   Smoking status: Every Day    Types: Cigars    Last attempt to quit: 03/24/2011    Years since quitting: 10.6   Smokeless tobacco: Never  Substance and Sexual Activity   Alcohol use: No    Comment: rarely   Drug use: No   Sexual activity: Never  Other Topics Concern   Not on file  Social History Narrative   Not on file   Social Determinants of Health   Financial Resource Strain: Not on file  Food Insecurity: Not on file  Transportation Needs: Not on file  Physical Activity: Not on file  Stress: Not on file  Social Connections:  Not on file  Intimate Partner Violence: Not on file     Outpatient Medications Prior to Visit  Medication Sig Dispense Refill   hydrochlorothiazide (HYDRODIURIL) 25 MG tablet Take 1 tablet (25 mg total) by mouth daily. 90 tablet 3   ibuprofen (ADVIL,MOTRIN) 600 MG tablet Take 1 tablet (600 mg total) by mouth every 6 (six) hours as needed. (Patient taking differently: Take 600 mg by mouth every 6 (six) hours as needed for moderate pain.) 30 tablet 0   No facility-administered medications prior to visit.    Allergies  Allergen Reactions   Penicillins Hives    ROS Review of Systems  Constitutional:  Negative for activity change and diaphoresis.  HENT:  Negative for congestion, ear pain, rhinorrhea and tinnitus.   Eyes:  Negative for discharge and redness.  Respiratory:  Negative for apnea, shortness of breath and wheezing.   Cardiovascular:  Positive for leg swelling. Negative for chest pain and palpitations.  Gastrointestinal:  Negative for abdominal distention, blood in stool and rectal pain.  Genitourinary:  Negative for difficulty urinating and frequency.  Musculoskeletal:  Positive for back pain. Negative for arthralgias and neck pain.  Skin:  Negative for color change and rash.  Neurological:  Negative for dizziness, facial asymmetry and headaches.  Psychiatric/Behavioral:  Negative for agitation, behavioral problems and confusion.       Objective:    Physical Exam Constitutional:      Appearance: Normal appearance. She is obese.  HENT:     Head: Normocephalic.     Right Ear: Tympanic membrane normal.     Left Ear: Tympanic membrane normal.     Nose: Nose normal.     Mouth/Throat:     Mouth: Mucous membranes are moist.     Pharynx: Oropharynx is clear.  Eyes:     Extraocular Movements: Extraocular movements intact.     Conjunctiva/sclera: Conjunctivae normal.     Pupils: Pupils are equal, round, and reactive to light.  Cardiovascular:     Rate and Rhythm: Normal  rate and regular rhythm.     Pulses: Normal pulses.  Pulmonary:     Effort: Pulmonary effort is normal. No respiratory distress.     Breath sounds: No rhonchi.  Abdominal:     General: Bowel sounds are normal.     Palpations: Abdomen is soft. There is no mass.     Tenderness: There is no abdominal tenderness.     Hernia: No hernia is present.  Genitourinary:    General: Normal vulva.     Vagina: Normal. No signs of injury.     Cervix: Normal.  Musculoskeletal:        General: No swelling or tenderness.  Skin:    Capillary Refill: Capillary refill takes less than 2 seconds.     Coloration: Skin is not jaundiced.     Findings: No erythema.  Neurological:     General: No focal deficit present.     Mental Status: She is alert and oriented to person, place, and time. Mental status is at baseline.  Psychiatric:        Mood and Affect: Mood normal.        Behavior: Behavior normal.        Thought Content: Thought content normal.        Judgment: Judgment normal.     BP 132/80   Pulse 68   Ht '5\' 4"'  (1.626 m)   LMP 07/17/2002 (Approximate)   BMI 50.05 kg/m  Wt Readings from Last 3 Encounters:  10/22/21 291 lb 9.6 oz (132.3 kg)  07/03/18 (!) 304 lb (137.9 kg)  09/17/16 (!) 310 lb (140.6 kg)     Health Maintenance Due  Topic Date Due   COVID-19 Vaccine (1) Never done   FOOT EXAM  Never done   OPHTHALMOLOGY EXAM  Never done   URINE MICROALBUMIN  Never done   HIV Screening  Never done   COLONOSCOPY (Pts 45-47yr Insurance coverage will need to be confirmed)  Never done   MAMMOGRAM  Never done   Zoster Vaccines- Shingrix (1 of 2) Never done   INFLUENZA VACCINE  10/20/2021    There are no preventive care reminders to display for this patient.  Lab Results  Component Value Date   TSH 1.32 10/23/2021   Lab Results  Component Value Date   WBC 5.3 10/23/2021   HGB 12.7 10/23/2021   HCT 38.3 10/23/2021   MCV 92.5 10/23/2021   PLT 236 10/23/2021   Lab Results   Component Value Date   NA 138 10/23/2021   K 4.5 10/23/2021   CO2 22 10/23/2021   GLUCOSE 323 (H) 10/23/2021   BUN 19 10/23/2021   CREATININE 0.95 10/23/2021   BILITOT  1.1 10/23/2021   ALKPHOS 126 (H) 05/13/2012   AST 13 10/23/2021   ALT 11 10/23/2021   PROT 6.9 10/23/2021   ALBUMIN 3.0 (L) 05/13/2012   CALCIUM 8.9 10/23/2021   EGFR 73 10/23/2021   Lab Results  Component Value Date   CHOL 256 (H) 10/23/2021   Lab Results  Component Value Date   HDL 58 10/23/2021   Lab Results  Component Value Date   LDLCALC 171 (H) 10/23/2021   Lab Results  Component Value Date   TRIG 132 10/23/2021   Lab Results  Component Value Date   CHOLHDL 4.4 10/23/2021   Lab Results  Component Value Date   HGBA1C 11.9 (H) 10/23/2021      Assessment & Plan:   Problem List Items Addressed This Visit       Endocrine   Type 2 diabetes mellitus without complication, without long-term current use of insulin (HCC)    Hemoglobin A1c 11.9 on 10/23/2021. Patient states that metformin gave her GI side effect in the past. Wilder Glade sample was provided. Advised patient to check blood sugar at home and bring to the next appointment.        Other   Morbid obesity ([facility])    Body mass index is 50.05 kg/m. Advised pt to lose weight. Would like to get bariatric surgery. Advised patient to avoid trans fat, fatty and fried food. Follow a regular physical activity schedule. Went over the risk of chronic diseases with increased weight.           Smoker - Primary    Smoking cessation was discussed, 5-7 minutes was spent of this topic specifically.         Meds ordered this encounter  Medications   omeprazole (PRILOSEC) 40 MG capsule    Sig: Take 1 capsule (40 mg total) by mouth daily.    Dispense:  30 capsule    Refill:  3   blood glucose meter kit and supplies    Sig: Dispense based on patient and insurance preference. Use up to four times daily as directed. (FOR ICD-10 E10.9, E11.9).     Dispense:  1 each    Refill:  0    Order Specific Question:   Number of strips    Answer:   100    Order Specific Question:   Number of lancets    Answer:   100     Follow-up: No follow-ups on file.    Theresia Lo, NP

## 2021-11-05 NOTE — Patient Instructions (Signed)
Dapagliflozin Tablets What is this medication? DAPAGLIFLOZIN (DAP a gli FLOE zin) treats type 2 diabetes. It works by helping your kidneys remove sugar (glucose) from your blood through the urine, which decreases your blood sugar. It can also be used to lower the risk of heart attack, stroke, kidney disease, and hospitalization for heart failure in people with type 2 diabetes. Changes to diet and exercise are often combined with this medication. This medicine may be used for other purposes; ask your health care provider or pharmacist if you have questions. COMMON BRAND NAME(S): Wilder Glade What should I tell my care team before I take this medication? They need to know if you have any of these conditions: Dehydration Diabetic ketoacidosis Diet low in salt Eating less due to illness, surgery, dieting, or any other reason Frequently drink alcohol Having surgery History of pancreatitis or pancreas problems History of yeast infection of the penis or vagina Infection in the bladder, kidneys, or urinary tract Kidney disease Low blood pressure On dialysis Problems urinating Type 1 diabetes Uncircumcised female An unusual or allergic reaction to dapagliflozin, other medications, foods, dyes, or preservatives Pregnant or trying to get pregnant Breast-feeding How should I use this medication? Take this medication by mouth with water. Take it as directed on the prescription label at the same time every day. You can take it with or without food. If it upsets your stomach, take it with food. Keep taking it unless your care team tells you to stop. A special MedGuide will be given to you by the pharmacist with each prescription and refill. Be sure to read this information carefully each time. Talk to your care team about the use of this medication in children. Special care may be needed. Overdosage: If you think you have taken too much of this medicine contact a poison control center or emergency room at  once. NOTE: This medicine is only for you. Do not share this medicine with others. What if I miss a dose? If you miss a dose, take it as soon as you can. If it is almost time for your next dose, take only that dose. Do not take double or extra doses. What may interact with this medication? Lithium Sulfonylureas, such as glimepiride, glipizide, glyburide This list may not describe all possible interactions. Give your health care provider a list of all the medicines, herbs, non-prescription drugs, or dietary supplements you use. Also tell them if you smoke, drink alcohol, or use illegal drugs. Some items may interact with your medicine. What should I watch for while using this medication? Visit your care team for regular checks on your progress. Tell your care team if your symptoms do not start to get better or if they get worse. This medication can cause a serious condition in which there is too much acid in the blood. If you develop nausea, vomiting, stomach pain, unusual tiredness, or breathing problems, stop taking this medication and call your care team right away. If possible, use a ketone dipstick to check for ketones in your urine. Check with your care team if you have severe diarrhea, nausea, and vomiting, or if you sweat a lot. The loss of too much body fluid may make it dangerous for you to take this medication. A test called the HbA1C (A1C) will be monitored. This is a simple blood test. It measures your blood sugar control over the last 2 to 3 months. You will receive this test every 3 to 6 months. Learn how to check your  blood sugar. Learn the symptoms of low and high blood sugar and how to manage them. Always carry a quick-source of sugar with you in case you have symptoms of low blood sugar. Examples include hard sugar candy or glucose tablets. Make sure others know that you can choke if you eat or drink when you develop serious symptoms of low blood sugar, such as seizures or  unconsciousness. Get medical help at once. Tell your care team if you have high blood sugar. You might need to change the dose of your medication. If you are sick or exercising more than usual, you may need to change the dose of your medication. Do not skip meals. Ask your care team if you should avoid alcohol. Many nonprescription cough and cold products contain sugar or alcohol. These can affect blood sugar. Wear a medical ID bracelet or chain. Carry a card that describes your condition. List the medications and doses you take on the card. What side effects may I notice from receiving this medication? Side effects that you should report to your care team as soon as possible: Allergic reactions--skin rash, itching, hives, swelling of the face, lips, tongue, or throat Dehydration--increased thirst, dry mouth, feeling faint or lightheaded, headache, dark yellow or brown urine Diabetic ketoacidosis (DKA)--increased thirst or amount of urine, dry mouth, fatigue, fruity odor to breath, trouble breathing, stomach pain, nausea, vomiting Genital yeast infection--redness, swelling, pain, or itchiness, odor, thick or lumpy discharge New pain or tenderness, change in skin color, sores or ulcers, infection of the leg or foot Infection or redness, swelling, tenderness, or pain in the genitals, or area from the genitals to the back of the rectum Urinary tract infection (UTI)--burning when passing urine, passing frequent small amounts of urine, bloody or cloudy urine, pain in the lower back or sides This list may not describe all possible side effects. Call your doctor for medical advice about side effects. You may report side effects to FDA at 1-800-FDA-1088. Where should I keep my medication? Keep out of the reach of children and pets. Store at room temperature between 20 and 25 degrees C (68 and 77 degrees F). Get rid of any unused medication after the expiration date. To get rid of medications that are no  longer needed or have expired: Take the medication to a medication take-back program. Check with your pharmacy or law enforcement to find a location. If you cannot return the medication, check the label or package insert to see if the medication should be thrown out in the garbage or flushed down the toilet. If you are not sure, ask your care team. If it is safe to put it in the trash, take the medication out of the container. Mix the medication with cat litter, dirt, coffee grounds, or other unwanted substance. Seal the mixture in a bag or container. Put it in the trash. NOTE: This sheet is a summary. It may not cover all possible information. If you have questions about this medicine, talk to your doctor, pharmacist, or health care provider.  2023 Elsevier/Gold Standard (2020-05-21 00:00:00)

## 2021-11-05 NOTE — Telephone Encounter (Signed)
Gastroenterology Pre-Procedure Review  Request Date: 12/17/2021 Requesting Physician: Dr. Vicente Males  PATIENT REVIEW QUESTIONS: The patient responded to the following health history questions as indicated:    1. Are you having any GI issues? no 2. Do you have a personal history of Polyps? no 3. Do you have a family history of Colon Cancer or Polyps? Maternal  grandfather and uncle   60. Diabetes Mellitus? Yes Farxiga 13m  5. Joint replacements in the past 12 months?no 6. Major health problems in the past 3 months?no 7. Any artificial heart valves, MVP, or defibrillator?no    MEDICATIONS & ALLERGIES:    Patient reports the following regarding taking any anticoagulation/antiplatelet therapy:   Plavix, Coumadin, Eliquis, Xarelto, Lovenox, Pradaxa, Brilinta, or Effient? no Aspirin? no  Patient confirms/reports the following medications:  Current Outpatient Medications  Medication Sig Dispense Refill   blood glucose meter kit and supplies Dispense based on patient and insurance preference. Use up to four times daily as directed. (FOR ICD-10 E10.9, E11.9). 1 each 0   hydrochlorothiazide (HYDRODIURIL) 25 MG tablet Take 1 tablet (25 mg total) by mouth daily. 90 tablet 3   ibuprofen (ADVIL,MOTRIN) 600 MG tablet Take 1 tablet (600 mg total) by mouth every 6 (six) hours as needed. (Patient taking differently: Take 600 mg by mouth every 6 (six) hours as needed for moderate pain.) 30 tablet 0   omeprazole (PRILOSEC) 40 MG capsule Take 1 capsule (40 mg total) by mouth daily. 30 capsule 3   No current facility-administered medications for this visit.    Patient confirms/reports the following allergies:  Allergies  Allergen Reactions   Penicillins Hives    No orders of the defined types were placed in this encounter.   AUTHORIZATION INFORMATION Primary Insurance: 1D#: Group #:  Secondary Insurance: 1D#: Group #:  SCHEDULE INFORMATION: Date:  Time: Location:

## 2021-11-17 ENCOUNTER — Telehealth: Payer: Self-pay

## 2021-11-17 NOTE — Telephone Encounter (Signed)
Patient contacted office to reschedule her colonoscopy.  Colonoscopy has been rescheduled to 01/08/22 still with Dr. Vicente Males.  Endoscopy has been notified of date change.  Instructions reviewed by phone and updated during call by patient on her copy of instructions.  Thanks,  [place], [place]

## 2021-11-18 NOTE — Assessment & Plan Note (Signed)
Body mass index is 50.05 kg/m. Advised pt to lose weight. Would like to get bariatric surgery. Advised patient to avoid trans fat, fatty and fried food. Follow a regular physical activity schedule. Went over the risk of chronic diseases with increased weight.

## 2021-11-18 NOTE — Assessment & Plan Note (Signed)
Hemoglobin A1c 11.9 on 10/23/2021. Patient states that metformin gave her GI side effect in the past. Wilder Glade sample was provided. Advised patient to check blood sugar at home and bring to the next appointment.

## 2021-11-18 NOTE — Assessment & Plan Note (Signed)
Smoking cessation was discussed, 5-7 minutes was spent of this topic specifically.   

## 2021-11-19 ENCOUNTER — Ambulatory Visit (INDEPENDENT_AMBULATORY_CARE_PROVIDER_SITE_OTHER): Payer: 59 | Admitting: Nurse Practitioner

## 2021-11-19 ENCOUNTER — Encounter: Payer: Self-pay | Admitting: Nurse Practitioner

## 2021-11-19 VITALS — BP 124/65 | HR 79 | Ht 65.0 in | Wt 291.1 lb

## 2021-11-19 DIAGNOSIS — R600 Localized edema: Secondary | ICD-10-CM | POA: Diagnosis not present

## 2021-11-19 DIAGNOSIS — E119 Type 2 diabetes mellitus without complications: Secondary | ICD-10-CM | POA: Diagnosis not present

## 2021-11-19 DIAGNOSIS — Z716 Tobacco abuse counseling: Secondary | ICD-10-CM | POA: Insufficient documentation

## 2021-11-19 MED ORDER — DAPAGLIFLOZIN PROPANEDIOL 5 MG PO TABS: 5.0000 mg | ORAL_TABLET | Freq: Every day | ORAL | 1 refills | Status: DC

## 2021-11-19 NOTE — Assessment & Plan Note (Signed)
Advised pt to check the BS regularly, make a log and bring to next appointment.  Advised pt to monitor diet and follow regular exercise routine. Continue Farxiga 5 mg daily.

## 2021-11-19 NOTE — Progress Notes (Signed)
Established Patient Office Visit  Subjective:  Patient ID: Jessica Richardson, female    DOB: 03/13/1971  Age: 51 y.o. MRN: [id number]  CC: No chief complaint on file.    HPI  TIEN AISPURO presents for follow up on diabetes. She is taking farxiga 5 mg.  Her BS number at home 270-320.   HPI   Past Medical History:  Diagnosis Date   Arthritis    Depression    Diabetes mellitus    Hip arthritis, left, post-SCFE 03/29/2011    Past Surgical History:  Procedure Laterality Date   ABDOMINAL HYSTERECTOMY     APPENDECTOMY     CESAREAN SECTION     JOINT REPLACEMENT     pins both hips   TIBIA IM NAIL INSERTION Right 05/15/2012   Procedure: Open Reduction Internal Fixation Right Tibia;  Surgeon: Johnny Bridge, MD;  Location: [place];  Service: Orthopedics;  Laterality: Right;   TONSILLECTOMY     TOTAL HIP ARTHROPLASTY  03/29/2011   Procedure: TOTAL HIP ARTHROPLASTY;  Surgeon: Johnny Bridge;  Location: [place];  Service: Orthopedics;  Laterality: Left;  with removal of retained hardware (knowles pins)    History reviewed. No pertinent family history.  Social History   Socioeconomic History   Marital status: Single    Spouse name: Not on file   Number of children: Not on file   Years of education: Not on file   Highest education level: Not on file  Occupational History   Not on file  Tobacco Use   Smoking status: Every Day    Types: Cigars    Last attempt to quit: 03/24/2011    Years since quitting: 10.6   Smokeless tobacco: Never   Tobacco comments:    Trying to quit. There are days she does not smoke at all.   Substance and Sexual Activity   Alcohol use: No    Comment: rarely   Drug use: No   Sexual activity: Never  Other Topics Concern   Not on file  Social History Narrative   Not on file   Social Determinants of Health   Financial Resource Strain: Not on file  Food Insecurity: Not on file  Transportation Needs: Not on file  Physical Activity: Not on file   Stress: Not on file  Social Connections: Not on file  Intimate Partner Violence: Not on file     Outpatient Medications Prior to Visit  Medication Sig Dispense Refill   blood glucose meter kit and supplies Dispense based on patient and insurance preference. Use up to four times daily as directed. (FOR ICD-10 E10.9, E11.9). 1 each 0   hydrochlorothiazide (HYDRODIURIL) 25 MG tablet Take 1 tablet (25 mg total) by mouth daily. 90 tablet 3   ibuprofen (ADVIL,MOTRIN) 600 MG tablet Take 1 tablet (600 mg total) by mouth every 6 (six) hours as needed. (Patient taking differently: Take 600 mg by mouth every 6 (six) hours as needed for moderate pain.) 30 tablet 0   omeprazole (PRILOSEC) 40 MG capsule Take 1 capsule (40 mg total) by mouth daily. 30 capsule 3   No facility-administered medications prior to visit.    Allergies  Allergen Reactions   Penicillins Hives    ROS Review of Systems  Constitutional: Negative.   HENT: Negative.    Eyes: Negative.   Respiratory:  Negative for chest tightness and shortness of breath.   Cardiovascular:  Negative for chest pain and palpitations.  Gastrointestinal: Negative.   Genitourinary:  Negative.   Musculoskeletal: Negative.   Neurological: Negative.   Hematological: Negative.       Objective:    Physical Exam Constitutional:      Appearance: Normal appearance. She is obese.  HENT:     Head: Normocephalic.     Right Ear: Tympanic membrane normal.     Left Ear: Tympanic membrane normal.     Nose: Nose normal.     Mouth/Throat:     Mouth: Mucous membranes are moist.     Pharynx: Oropharynx is clear.  Eyes:     Extraocular Movements: Extraocular movements intact.     Conjunctiva/sclera: Conjunctivae normal.     Pupils: Pupils are equal, round, and reactive to light.  Cardiovascular:     Rate and Rhythm: Normal rate and regular rhythm.     Pulses: Normal pulses.     Heart sounds: Normal heart sounds.  Pulmonary:     Effort: Pulmonary  effort is normal. No respiratory distress.     Breath sounds: Normal breath sounds. No rhonchi.  Abdominal:     General: Bowel sounds are normal.     Palpations: Abdomen is soft. There is no mass.     Tenderness: There is no abdominal tenderness.     Hernia: No hernia is present.  Musculoskeletal:     Cervical back: Neck supple. No tenderness.     Right lower leg: Edema present.  Skin:    General: Skin is warm.     Capillary Refill: Capillary refill takes less than 2 seconds.  Neurological:     General: No focal deficit present.     Mental Status: She is alert and oriented to person, place, and time. Mental status is at baseline.  Psychiatric:        Mood and Affect: Mood normal.        Behavior: Behavior normal.        Thought Content: Thought content normal.        Judgment: Judgment normal.     BP 124/65   Pulse 79   Ht '5\' 5"'  (1.651 m)   Wt 291 lb 2 oz (132.1 kg)   LMP 07/17/2002 (Approximate)   BMI 48.45 kg/m  Wt Readings from Last 3 Encounters:  11/19/21 291 lb 2 oz (132.1 kg)  10/22/21 291 lb 9.6 oz (132.3 kg)  07/03/18 (!) 304 lb (137.9 kg)     Health Maintenance  Topic Date Due   COVID-19 Vaccine (1) Never done   FOOT EXAM  Never done   OPHTHALMOLOGY EXAM  Never done   URINE MICROALBUMIN  Never done   HIV Screening  Never done   COLONOSCOPY (Pts 45-48yr Insurance coverage will need to be confirmed)  Never done   MAMMOGRAM  Never done   Zoster Vaccines- Shingrix (1 of 2) Never done   INFLUENZA VACCINE  10/20/2021   HEMOGLOBIN A1C  04/25/2022   PAP SMEAR-Modifier  10/22/2024   TETANUS/TDAP  10/23/2031   Hepatitis C Screening  Completed   HPV VACCINES  Aged Out    There are no preventive care reminders to display for this patient.  Lab Results  Component Value Date   TSH 1.32 10/23/2021   Lab Results  Component Value Date   WBC 5.3 10/23/2021   HGB 12.7 10/23/2021   HCT 38.3 10/23/2021   MCV 92.5 10/23/2021   PLT 236 10/23/2021   Lab Results   Component Value Date   NA 138 10/23/2021   K 4.5 10/23/2021  CO2 22 10/23/2021   GLUCOSE 323 (H) 10/23/2021   BUN 19 10/23/2021   CREATININE 0.95 10/23/2021   BILITOT 1.1 10/23/2021   ALKPHOS 126 (H) 05/13/2012   AST 13 10/23/2021   ALT 11 10/23/2021   PROT 6.9 10/23/2021   ALBUMIN 3.0 (L) 05/13/2012   CALCIUM 8.9 10/23/2021   EGFR 73 10/23/2021   Lab Results  Component Value Date   CHOL 256 (H) 10/23/2021   Lab Results  Component Value Date   HDL 58 10/23/2021   Lab Results  Component Value Date   LDLCALC 171 (H) 10/23/2021   Lab Results  Component Value Date   TRIG 132 10/23/2021   Lab Results  Component Value Date   CHOLHDL 4.4 10/23/2021   Lab Results  Component Value Date   HGBA1C 11.9 (H) 10/23/2021      Assessment & Plan:   Problem List Items Addressed This Visit       Endocrine   Type 2 diabetes mellitus without complication, without long-term current use of insulin ([place]) - Primary    Advised pt to check the BS regularly, make a log and bring to next appointment.  Advised pt to monitor diet and follow regular exercise routine. Continue Farxiga 5 mg daily.         Relevant Medications   dapagliflozin propanediol (FARXIGA) 5 MG TABS tablet     Other   Lower leg edema    Right leg edema more than the left. Continue hydrochlorothiazide.      Tobacco abuse counseling    Smoking cessation was discussed. She is trying to quit smoking by herself and is not willing to take any medication to quit at present.          Meds ordered this encounter  Medications   dapagliflozin propanediol (FARXIGA) 5 MG TABS tablet    Sig: Take 1 tablet (5 mg total) by mouth daily before breakfast.    Dispense:  30 tablet    Refill:  1     Follow-up: No follow-ups on file.    Theresia Lo, NP

## 2021-11-19 NOTE — Assessment & Plan Note (Signed)
Smoking cessation was discussed. She is trying to quit smoking by herself and is not willing to take any medication to quit at present.

## 2021-11-19 NOTE — Assessment & Plan Note (Signed)
Right leg edema more than the left. Continue hydrochlorothiazide.

## 2021-11-28 ENCOUNTER — Other Ambulatory Visit: Payer: Self-pay | Admitting: *Deleted

## 2021-11-28 DIAGNOSIS — Z1231 Encounter for screening mammogram for malignant neoplasm of breast: Secondary | ICD-10-CM

## 2021-12-03 ENCOUNTER — Ambulatory Visit: Payer: 59 | Admitting: Nurse Practitioner

## 2021-12-03 ENCOUNTER — Encounter: Payer: Self-pay | Admitting: Nurse Practitioner

## 2021-12-03 VITALS — BP 134/78 | HR 83 | Ht 65.0 in | Wt 288.4 lb

## 2021-12-03 DIAGNOSIS — Z0100 Encounter for examination of eyes and vision without abnormal findings: Secondary | ICD-10-CM

## 2021-12-03 DIAGNOSIS — E119 Type 2 diabetes mellitus without complications: Secondary | ICD-10-CM | POA: Diagnosis not present

## 2021-12-03 LAB — PAP LB (LIQUID-BASED): PAP Smear Comment: 0

## 2021-12-03 LAB — GLUCOSE, POCT (MANUAL RESULT ENTRY): POC Glucose: 336 mg/dl — AB (ref 70–99)

## 2021-12-03 NOTE — Assessment & Plan Note (Signed)
Her blood sugar 336 in the office today. Started patient on Wegovy. Continue Farxiga 5 mg Advised patient to watch diet and follow regular exercise routine.

## 2021-12-03 NOTE — Progress Notes (Signed)
Established Patient Office Visit  Subjective:  Patient ID: Jessica Richardson, female    DOB: 08-18-1970  Age: 51 y.o. MRN: [id number]  CC:  Chief Complaint  Patient presents with   Diabetes     HPI  Jessica Richardson presents for follow-up on diabetes.  Her average blood sugar at home 280. She is taking Farxiga 5 mg daily.  Patient has no family or personal history of medullary thyroid carcinoma.  HPI   Past Medical History:  Diagnosis Date   Arthritis    Depression    Diabetes mellitus    Hip arthritis, left, post-SCFE 03/29/2011    Past Surgical History:  Procedure Laterality Date   ABDOMINAL HYSTERECTOMY     APPENDECTOMY     CESAREAN SECTION     JOINT REPLACEMENT     pins both hips   TIBIA IM NAIL INSERTION Right 05/15/2012   Procedure: Open Reduction Internal Fixation Right Tibia;  Surgeon: Johnny Bridge, MD;  Location: [place];  Service: Orthopedics;  Laterality: Right;   TONSILLECTOMY     TOTAL HIP ARTHROPLASTY  03/29/2011   Procedure: TOTAL HIP ARTHROPLASTY;  Surgeon: Johnny Bridge;  Location: [place];  Service: Orthopedics;  Laterality: Left;  with removal of retained hardware (knowles pins)    No family history on file.  Social History   Socioeconomic History   Marital status: Single    Spouse name: Not on file   Number of children: Not on file   Years of education: Not on file   Highest education level: Not on file  Occupational History   Not on file  Tobacco Use   Smoking status: Every Day    Types: Cigars    Last attempt to quit: 03/24/2011    Years since quitting: 10.7   Smokeless tobacco: Never   Tobacco comments:    Trying to quit. There are days she does not smoke at all.   Substance and Sexual Activity   Alcohol use: No    Comment: rarely   Drug use: No   Sexual activity: Never  Other Topics Concern   Not on file  Social History Narrative   Not on file   Social Determinants of Health   Financial Resource Strain: Not on file  Food  Insecurity: Not on file  Transportation Needs: Not on file  Physical Activity: Not on file  Stress: Not on file  Social Connections: Not on file  Intimate Partner Violence: Not on file     Outpatient Medications Prior to Visit  Medication Sig Dispense Refill   blood glucose meter kit and supplies Dispense based on patient and insurance preference. Use up to four times daily as directed. (FOR ICD-10 E10.9, E11.9). 1 each 0   dapagliflozin propanediol (FARXIGA) 5 MG TABS tablet Take 1 tablet (5 mg total) by mouth daily before breakfast. 30 tablet 1   hydrochlorothiazide (HYDRODIURIL) 25 MG tablet Take 1 tablet (25 mg total) by mouth daily. 90 tablet 3   ibuprofen (ADVIL,MOTRIN) 600 MG tablet Take 1 tablet (600 mg total) by mouth every 6 (six) hours as needed. (Patient taking differently: Take 600 mg by mouth every 6 (six) hours as needed for moderate pain.) 30 tablet 0   omeprazole (PRILOSEC) 40 MG capsule Take 1 capsule (40 mg total) by mouth daily. 30 capsule 3   No facility-administered medications prior to visit.    Allergies  Allergen Reactions   Penicillins Hives    ROS Review of Systems  Constitutional: Negative.   HENT: Negative.    Eyes: Negative.   Respiratory:  Negative for chest tightness and shortness of breath.   Cardiovascular:  Negative for chest pain and palpitations.  Gastrointestinal: Negative.   Genitourinary: Negative.   Musculoskeletal: Negative.   Neurological: Negative.   Psychiatric/Behavioral: Negative.        Objective:    Physical Exam Constitutional:      Appearance: Normal appearance. She is obese.  HENT:     Head: Normocephalic.     Right Ear: Tympanic membrane normal.     Left Ear: Tympanic membrane normal.     Nose: Nose normal.     Mouth/Throat:     Mouth: Mucous membranes are moist.     Pharynx: Oropharynx is clear.  Eyes:     Extraocular Movements: Extraocular movements intact.     Conjunctiva/sclera: Conjunctivae normal.      Pupils: Pupils are equal, round, and reactive to light.  Cardiovascular:     Rate and Rhythm: Normal rate and regular rhythm.     Pulses: Normal pulses.     Heart sounds: Normal heart sounds.  Pulmonary:     Effort: Pulmonary effort is normal. No respiratory distress.     Breath sounds: Normal breath sounds. No rhonchi.  Abdominal:     General: Bowel sounds are normal.     Palpations: Abdomen is soft. There is no mass.     Tenderness: There is no abdominal tenderness.     Hernia: No hernia is present.  Musculoskeletal:        General: Normal range of motion.     Cervical back: Neck supple. No tenderness.  Skin:    General: Skin is warm.     Capillary Refill: Capillary refill takes less than 2 seconds.  Neurological:     General: No focal deficit present.     Mental Status: She is alert and oriented to person, place, and time. Mental status is at baseline.  Psychiatric:        Mood and Affect: Mood normal.        Behavior: Behavior normal.        Thought Content: Thought content normal.        Judgment: Judgment normal.     BP 134/78   Pulse 83   Ht '5\' 5"'  (1.651 m)   Wt 288 lb 6.4 oz (130.8 kg)   LMP 07/17/2002 (Approximate)   BMI 47.99 kg/m  Wt Readings from Last 3 Encounters:  12/03/21 288 lb 6.4 oz (130.8 kg)  11/19/21 291 lb 2 oz (132.1 kg)  10/22/21 291 lb 9.6 oz (132.3 kg)     Health Maintenance  Topic Date Due   COVID-19 Vaccine (1) Never done   FOOT EXAM  Never done   OPHTHALMOLOGY EXAM  Never done   HIV Screening  Never done   Diabetic kidney evaluation - Urine ACR  Never done   COLONOSCOPY (Pts 45-107yr Insurance coverage will need to be confirmed)  Never done   MAMMOGRAM  Never done   Zoster Vaccines- Shingrix (1 of 2) Never done   INFLUENZA VACCINE  Never done   HEMOGLOBIN A1C  04/25/2022   Diabetic kidney evaluation - GFR measurement  10/24/2022   PAP SMEAR-Modifier  10/22/2024   TETANUS/TDAP  10/23/2031   Hepatitis C Screening  Completed   HPV  VACCINES  Aged Out    There are no preventive care reminders to display for this patient.  Lab Results  Component  Value Date   TSH 1.32 10/23/2021   Lab Results  Component Value Date   WBC 5.3 10/23/2021   HGB 12.7 10/23/2021   HCT 38.3 10/23/2021   MCV 92.5 10/23/2021   PLT 236 10/23/2021   Lab Results  Component Value Date   NA 138 10/23/2021   K 4.5 10/23/2021   CO2 22 10/23/2021   GLUCOSE 323 (H) 10/23/2021   BUN 19 10/23/2021   CREATININE 0.95 10/23/2021   BILITOT 1.1 10/23/2021   ALKPHOS 126 (H) 05/13/2012   AST 13 10/23/2021   ALT 11 10/23/2021   PROT 6.9 10/23/2021   ALBUMIN 3.0 (L) 05/13/2012   CALCIUM 8.9 10/23/2021   EGFR 73 10/23/2021   Lab Results  Component Value Date   CHOL 256 (H) 10/23/2021   Lab Results  Component Value Date   HDL 58 10/23/2021   Lab Results  Component Value Date   LDLCALC 171 (H) 10/23/2021   Lab Results  Component Value Date   TRIG 132 10/23/2021   Lab Results  Component Value Date   CHOLHDL 4.4 10/23/2021   Lab Results  Component Value Date   HGBA1C 11.9 (H) 10/23/2021      Assessment & Plan:   Problem List Items Addressed This Visit       Endocrine   Type 2 diabetes mellitus without complication, without long-term current use of insulin ([facility])    Her blood sugar 336 in the office today. Started patient on Wegovy. Continue Farxiga 5 mg Advised patient to watch diet and follow regular exercise routine.        Relevant Orders   POCT glucose (manual entry) (Completed)   Microalbumin, urine   Other Visit Diagnoses     Diabetic eye exam ([place])    -  Primary   Relevant Orders   Ambulatory referral to Ophthalmology        No orders of the defined types were placed in this encounter.    Follow-up: Return in about 1 month (around 01/02/2022).    Theresia Lo, NP

## 2021-12-04 LAB — MICROALBUMIN, URINE: Microalb, Ur: <0.2 mg/dL

## 2021-12-15 ENCOUNTER — Other Ambulatory Visit: Payer: Self-pay | Admitting: *Deleted

## 2021-12-15 MED ORDER — DAPAGLIFLOZIN PROPANEDIOL 5 MG PO TABS: 5.0000 mg | ORAL_TABLET | Freq: Every day | ORAL | 1 refills | Status: DC

## 2021-12-17 ENCOUNTER — Ambulatory Visit
Admission: RE | Admit: 2021-12-17 | Discharge: 2021-12-17 | Disposition: A | Discharge status: Home / Self Care | Payer: 59 | Source: Ambulatory Visit | Attending: Nurse Practitioner | Admitting: Nurse Practitioner

## 2021-12-17 DIAGNOSIS — Z1231 Encounter for screening mammogram for malignant neoplasm of breast: Secondary | ICD-10-CM | POA: Diagnosis not present

## 2021-12-29 NOTE — Telephone Encounter (Signed)
Patient colonoscopy has been rescheduled to 02/05/22.   Thanks,  [place], [place]

## 2022-01-01 ENCOUNTER — Ambulatory Visit (INDEPENDENT_AMBULATORY_CARE_PROVIDER_SITE_OTHER): Payer: No Typology Code available for payment source | Admitting: Nurse Practitioner

## 2022-01-01 ENCOUNTER — Encounter: Payer: Self-pay | Admitting: Nurse Practitioner

## 2022-01-01 VITALS — BP 132/86 | HR 84 | Ht 65.0 in | Wt 286.6 lb

## 2022-01-01 DIAGNOSIS — E119 Type 2 diabetes mellitus without complications: Secondary | ICD-10-CM

## 2022-01-01 LAB — GLUCOSE, POCT (MANUAL RESULT ENTRY): POC Glucose: 220 mg/dl — AB (ref 70–99)

## 2022-01-01 MED ORDER — HYDROCHLOROTHIAZIDE 25 MG PO TABS: 25.0000 mg | ORAL_TABLET | Freq: Every day | ORAL | 3 refills | Status: DC

## 2022-01-01 MED ORDER — LINAGLIPTIN 5 MG PO TABS: 5.0000 mg | ORAL_TABLET | Freq: Every day | ORAL | 0 refills | Status: DC

## 2022-01-01 NOTE — Progress Notes (Signed)
Established Patient Office Visit  Subjective:  Patient ID: Jessica Richardson, female    DOB: 1970-09-13  Age: 51 y.o. MRN: [id number]  CC:  Chief Complaint  Patient presents with   Diabetes     HPI  Jessica Richardson presents for follow-up on diabetes.  Her blood sugar at home lies between 220-270 Her insurance does not  covers [place]. Patient was provided with the sample of wegovy in September and the [facility] is helping her but insurance does not cover [phone].  HPI   Past Medical History:  Diagnosis Date   Arthritis    Depression    Diabetes mellitus    Hip arthritis, left, post-SCFE 03/29/2011    Past Surgical History:  Procedure Laterality Date   ABDOMINAL HYSTERECTOMY     APPENDECTOMY     CESAREAN SECTION     JOINT REPLACEMENT     pins both hips   TIBIA IM NAIL INSERTION Right 05/15/2012   Procedure: Open Reduction Internal Fixation Right Tibia;  Surgeon: Johnny Bridge, MD;  Location: [place];  Service: Orthopedics;  Laterality: Right;   TONSILLECTOMY     TOTAL HIP ARTHROPLASTY  03/29/2011   Procedure: TOTAL HIP ARTHROPLASTY;  Surgeon: Johnny Bridge;  Location: [place];  Service: Orthopedics;  Laterality: Left;  with removal of retained hardware (knowles pins)    Family History  Problem Relation Age of Onset   Breast cancer Maternal Aunt     Social History   Socioeconomic History   Marital status: Single    Spouse name: Not on file   Number of children: Not on file   Years of education: Not on file   Highest education level: Not on file  Occupational History   Not on file  Tobacco Use   Smoking status: Every Day    Types: Cigars    Last attempt to quit: 03/24/2011    Years since quitting: 10.8   Smokeless tobacco: Never   Tobacco comments:    Trying to quit. There are days she does not smoke at all.   Substance and Sexual Activity   Alcohol use: No    Comment: rarely   Drug use: No   Sexual activity: Never  Other Topics Concern   Not on file   Social History Narrative   Not on file   Social Determinants of Health   Financial Resource Strain: Not on file  Food Insecurity: Not on file  Transportation Needs: Not on file  Physical Activity: Not on file  Stress: Not on file  Social Connections: Not on file  Intimate Partner Violence: Not on file     Outpatient Medications Prior to Visit  Medication Sig Dispense Refill   blood glucose meter kit and supplies Dispense based on patient and insurance preference. Use up to four times daily as directed. (FOR ICD-10 E10.9, E11.9). 1 each 0   ibuprofen (ADVIL,MOTRIN) 600 MG tablet Take 1 tablet (600 mg total) by mouth every 6 (six) hours as needed. (Patient taking differently: Take 600 mg by mouth every 6 (six) hours as needed for moderate pain.) 30 tablet 0   omeprazole (PRILOSEC) 40 MG capsule Take 1 capsule (40 mg total) by mouth daily. 30 capsule 3   hydrochlorothiazide (HYDRODIURIL) 25 MG tablet Take 1 tablet (25 mg total) by mouth daily. 90 tablet 3   dapagliflozin propanediol (FARXIGA) 5 MG TABS tablet Take 1 tablet (5 mg total) by mouth daily before breakfast. (Patient not taking: Reported on 01/01/2022)  30 tablet 1   No facility-administered medications prior to visit.    Allergies  Allergen Reactions   Penicillins Hives    ROS Review of Systems  Constitutional: Negative.   HENT: Negative.    Eyes: Negative.   Respiratory:  Negative for chest tightness and shortness of breath.   Cardiovascular:  Negative for palpitations.  Gastrointestinal: Negative.   Genitourinary: Negative.   Musculoskeletal: Negative.   Neurological: Negative.   Psychiatric/Behavioral: Negative.        Objective:    Physical Exam Constitutional:      Appearance: Normal appearance. She is obese.  HENT:     Head: Normocephalic.     Right Ear: Tympanic membrane normal.     Left Ear: Tympanic membrane normal.     Nose: Nose normal.     Mouth/Throat:     Mouth: Mucous membranes are  moist.     Pharynx: Oropharynx is clear.  Eyes:     Extraocular Movements: Extraocular movements intact.     Conjunctiva/sclera: Conjunctivae normal.     Pupils: Pupils are equal, round, and reactive to light.  Cardiovascular:     Rate and Rhythm: Normal rate and regular rhythm.     Pulses: Normal pulses.     Heart sounds: Normal heart sounds.  Pulmonary:     Effort: Pulmonary effort is normal. No respiratory distress.     Breath sounds: Normal breath sounds. No rhonchi.  Abdominal:     General: Bowel sounds are normal.     Palpations: Abdomen is soft. There is no mass.     Tenderness: There is no abdominal tenderness.     Hernia: No hernia is present.  Musculoskeletal:        General: Normal range of motion.     Cervical back: Neck supple. No tenderness.  Skin:    General: Skin is warm.     Capillary Refill: Capillary refill takes less than 2 seconds.  Neurological:     General: No focal deficit present.     Mental Status: She is alert and oriented to person, place, and time. Mental status is at baseline.  Psychiatric:        Mood and Affect: Mood normal.        Behavior: Behavior normal.        Thought Content: Thought content normal.        Judgment: Judgment normal.     BP 132/86   Pulse 84   Ht _0  (1.651 m)   Wt 286 lb 9.6 oz (130 kg)   LMP 07/17/2002 (Approximate)   BMI 47.69 kg/m  Wt Readings from Last 3 Encounters:  01/01/22 286 lb 9.6 oz (130 kg)  12/03/21 288 lb 6.4 oz (130.8 kg)  11/19/21 291 lb 2 oz (132.1 kg)     Health Maintenance  Topic Date Due   FOOT EXAM  Never done   OPHTHALMOLOGY EXAM  Never done   Diabetic kidney evaluation - Urine ACR  Never done   COLONOSCOPY (Pts 45-79yr Insurance coverage will need to be confirmed)  02/05/2022 (Originally 08/09/2015)   Zoster Vaccines- Shingrix (1 of 2) 04/03/2022 (Originally 08/08/2020)   HIV Screening  01/02/2023 (Originally 08/08/1985)   INFLUENZA VACCINE  01/20/2023 (Originally 10/20/2021)    COVID-19 Vaccine (1) 01/20/2023 (Originally 02/09/1971)   HEMOGLOBIN A1C  04/25/2022   Diabetic kidney evaluation - GFR measurement  10/24/2022   MAMMOGRAM  12/18/2023   PAP SMEAR-Modifier  10/22/2024   TETANUS/TDAP  10/23/2031  Hepatitis C Screening  Completed   HPV VACCINES  Aged Out    There are no preventive care reminders to display for this patient.  Lab Results  Component Value Date   TSH 1.32 10/23/2021   Lab Results  Component Value Date   WBC 5.3 10/23/2021   HGB 12.7 10/23/2021   HCT 38.3 10/23/2021   MCV 92.5 10/23/2021   PLT 236 10/23/2021   Lab Results  Component Value Date   NA 138 10/23/2021   K 4.5 10/23/2021   CO2 22 10/23/2021   GLUCOSE 323 (H) 10/23/2021   BUN 19 10/23/2021   CREATININE 0.95 10/23/2021   BILITOT 1.1 10/23/2021   ALKPHOS 126 (H) 05/13/2012   AST 13 10/23/2021   ALT 11 10/23/2021   PROT 6.9 10/23/2021   ALBUMIN 3.0 (L) 05/13/2012   CALCIUM 8.9 10/23/2021   EGFR 73 10/23/2021   Lab Results  Component Value Date   CHOL 256 (H) 10/23/2021   Lab Results  Component Value Date   HDL 58 10/23/2021   Lab Results  Component Value Date   LDLCALC 171 (H) 10/23/2021   Lab Results  Component Value Date   TRIG 132 10/23/2021   Lab Results  Component Value Date   CHOLHDL 4.4 10/23/2021   Lab Results  Component Value Date   HGBA1C 11.9 (H) 10/23/2021      Assessment & Plan:   Problem List Items Addressed This Visit       Endocrine   Type 2 diabetes mellitus without complication, without long-term current use of insulin ([place]) - Primary    Patient BS 336 in the office today. Started her on Tradjenta 5 mg daily. Advised pt to check the BS regularly, make a log and bring to next appointment.  Advised pt to monitor diet. Advised pt to eat variety of food including fruits, vegetables, whole grains, complex carbohydrates and proteins.        Relevant Medications   linagliptin (TRADJENTA) 5 MG TABS tablet   Other Relevant  Orders   POCT glucose (manual entry) (Completed)   Ambulatory referral to Podiatry   Microalbumin, urine     Meds ordered this encounter  Medications   hydrochlorothiazide (HYDRODIURIL) 25 MG tablet    Sig: Take 1 tablet (25 mg total) by mouth daily.    Dispense:  90 tablet    Refill:  3   linagliptin (TRADJENTA) 5 MG TABS tablet    Sig: Take 1 tablet (5 mg total) by mouth daily.    Dispense:  30 tablet    Refill:  0      Follow-up: No follow-ups on file.    Theresia Lo, NP

## 2022-01-19 ENCOUNTER — Encounter: Payer: Self-pay | Admitting: Nurse Practitioner

## 2022-01-19 NOTE — Assessment & Plan Note (Signed)
Patient BS 336 in the office today. Started her on Tradjenta 5 mg daily. Advised pt to check the BS regularly, make a log and bring to next appointment.  Advised pt to monitor diet. Advised pt to eat variety of food including fruits, vegetables, whole grains, complex carbohydrates and proteins.

## 2022-01-21 ENCOUNTER — Ambulatory Visit: Payer: Self-pay | Admitting: Podiatry

## 2022-01-24 ENCOUNTER — Other Ambulatory Visit: Payer: Self-pay | Admitting: Nurse Practitioner

## 2022-01-29 ENCOUNTER — Ambulatory Visit: Payer: No Typology Code available for payment source | Admitting: Nurse Practitioner

## 2022-01-29 ENCOUNTER — Encounter: Payer: Self-pay | Admitting: Nurse Practitioner

## 2022-01-29 VITALS — BP 143/79 | HR 81 | Temp 97.3°F | Resp 16 | Ht 65.0 in | Wt 291.6 lb

## 2022-01-29 DIAGNOSIS — M7989 Other specified soft tissue disorders: Secondary | ICD-10-CM | POA: Diagnosis not present

## 2022-01-29 DIAGNOSIS — I10 Essential (primary) hypertension: Secondary | ICD-10-CM | POA: Insufficient documentation

## 2022-01-29 DIAGNOSIS — E119 Type 2 diabetes mellitus without complications: Secondary | ICD-10-CM

## 2022-01-29 LAB — GLUCOSE, POCT (MANUAL RESULT ENTRY): POC Glucose: 372 mg/dl — AB (ref 70–99)

## 2022-01-29 MED ORDER — OZEMPIC (0.25 OR 0.5 MG/DOSE) 2 MG/3ML ~~LOC~~ SOPN: 0.2500 mg | PEN_INJECTOR | SUBCUTANEOUS | 1 refills | Status: DC

## 2022-01-29 MED ORDER — DAPAGLIFLOZIN PROPANEDIOL 10 MG PO TABS: 10.0000 mg | ORAL_TABLET | Freq: Every day | ORAL | 0 refills | Status: DC

## 2022-01-29 MED ORDER — LOSARTAN POTASSIUM-HCTZ 50-12.5 MG PO TABS: 1.0000 | ORAL_TABLET | Freq: Every day | ORAL | 1 refills | Status: DC

## 2022-01-29 MED ORDER — GLIPIZIDE 5 MG PO TABS: 5.0000 mg | ORAL_TABLET | Freq: Two times a day (BID) | ORAL | 3 refills | Status: DC

## 2022-01-29 NOTE — Assessment & Plan Note (Addendum)
Her blood sugar 372 in the office today. Patient was not taking any medication for diabetes. Encouraged her to take medication, consume healthy diet and follow regular exercise schedule Sample provided for farxiga 10 mg. Started her on Ozempic and glipizide.

## 2022-01-29 NOTE — Progress Notes (Signed)
Established Patient Office Visit  Subjective:  Patient ID: KEMANI HEIDEL, female    DOB: 1971-03-06  Age: 51 y.o. MRN: [id number]  CC:  Chief Complaint  Patient presents with   Diabetes     HPI  Jessica Richardson presents for diabetes follow up. Her BS number running around 200. She is taking no medication for diabetes because her insurance does not cover tradjenta or farxiga. She was not taking metformin due to GI issues.   She is only taking HCTZ .   HPI   Past Medical History:  Diagnosis Date   Arthritis    Depression    Diabetes mellitus    Hip arthritis, left, post-SCFE 03/29/2011    Past Surgical History:  Procedure Laterality Date   ABDOMINAL HYSTERECTOMY     APPENDECTOMY     CESAREAN SECTION     COLONOSCOPY WITH PROPOFOL N/A 02/05/2022   Procedure: COLONOSCOPY WITH PROPOFOL;  Surgeon: Jonathon Bellows, MD;  Location: [facility] ENDOSCOPY;  Service: Gastroenterology;  Laterality: N/A;   FRACTURE SURGERY     JOINT REPLACEMENT     pins both hips   TIBIA IM NAIL INSERTION Right 05/15/2012   Procedure: Open Reduction Internal Fixation Right Tibia;  Surgeon: Johnny Bridge, MD;  Location: [place];  Service: Orthopedics;  Laterality: Right;   TONSILLECTOMY     TOTAL HIP ARTHROPLASTY  03/29/2011   Procedure: TOTAL HIP ARTHROPLASTY;  Surgeon: Johnny Bridge;  Location: [place];  Service: Orthopedics;  Laterality: Left;  with removal of retained hardware (knowles pins)    Family History  Problem Relation Age of Onset   Breast cancer Maternal Aunt     Social History   Socioeconomic History   Marital status: Single    Spouse name: Not on file   Number of children: Not on file   Years of education: Not on file   Highest education level: Not on file  Occupational History   Not on file  Tobacco Use   Smoking status: Every Day    Types: Cigars    Last attempt to quit: 03/24/2011    Years since quitting: 10.9   Smokeless tobacco: Never   Tobacco comments:    Trying to  quit. There are days she does not smoke at all.   Vaping Use   Vaping Use: Never used  Substance and Sexual Activity   Alcohol use: No    Comment: rarely   Drug use: No   Sexual activity: Never  Other Topics Concern   Not on file  Social History Narrative   Not on file   Social Determinants of Health   Financial Resource Strain: Not on file  Food Insecurity: Not on file  Transportation Needs: Not on file  Physical Activity: Not on file  Stress: Not on file  Social Connections: Not on file  Intimate Partner Violence: Not on file     Outpatient Medications Prior to Visit  Medication Sig Dispense Refill   blood glucose meter kit and supplies Dispense based on patient and insurance preference. Use up to four times daily as directed. (FOR ICD-10 E10.9, E11.9). 1 each 0   ibuprofen (ADVIL,MOTRIN) 600 MG tablet Take 1 tablet (600 mg total) by mouth every 6 (six) hours as needed. (Patient taking differently: Take 600 mg by mouth every 6 (six) hours as needed for moderate pain.) 30 tablet 0   hydrochlorothiazide (HYDRODIURIL) 25 MG tablet Take 1 tablet (25 mg total) by mouth daily. 90 tablet 3  omeprazole (PRILOSEC) 40 MG capsule Take 1 capsule (40 mg total) by mouth daily. 30 capsule 3   dapagliflozin propanediol (FARXIGA) 5 MG TABS tablet Take 1 tablet (5 mg total) by mouth daily before breakfast. (Patient not taking: Reported on 01/01/2022) 30 tablet 1   linagliptin (TRADJENTA) 5 MG TABS tablet Take 1 tablet (5 mg total) by mouth daily. (Patient not taking: Reported on 01/29/2022) 30 tablet 0   No facility-administered medications prior to visit.    Allergies  Allergen Reactions   Metformin And Related     GI Upset   Penicillins Hives    ROS Review of Systems  Constitutional: Negative.   HENT: Negative.    Eyes: Negative.   Respiratory:  Negative for chest tightness and shortness of breath.   Cardiovascular:  Positive for leg swelling.  Gastrointestinal: Negative.    Genitourinary: Negative.   Musculoskeletal: Negative.   Neurological: Negative.   Psychiatric/Behavioral: Negative.        Objective:    Physical Exam Constitutional:      Appearance: Normal appearance. She is obese.  HENT:     Head: Normocephalic.     Right Ear: Tympanic membrane normal.     Left Ear: Tympanic membrane normal.     Nose: Nose normal.     Mouth/Throat:     Mouth: Mucous membranes are moist.     Pharynx: Oropharynx is clear.  Eyes:     Extraocular Movements: Extraocular movements intact.     Conjunctiva/sclera: Conjunctivae normal.     Pupils: Pupils are equal, round, and reactive to light.  Cardiovascular:     Rate and Rhythm: Normal rate and regular rhythm.     Pulses: Normal pulses.     Heart sounds: Normal heart sounds.  Pulmonary:     Effort: Pulmonary effort is normal. No respiratory distress.     Breath sounds: Normal breath sounds. No rhonchi.  Abdominal:     General: Bowel sounds are normal.     Palpations: Abdomen is soft. There is no mass.     Tenderness: There is no abdominal tenderness.     Hernia: No hernia is present.  Musculoskeletal:        General: Swelling (right ankel> left ankel) present.     Cervical back: Neck supple. No tenderness.  Skin:    General: Skin is warm.     Capillary Refill: Capillary refill takes less than 2 seconds.  Neurological:     General: No focal deficit present.     Mental Status: She is alert and oriented to person, place, and time. Mental status is at baseline.  Psychiatric:        Mood and Affect: Mood normal.        Behavior: Behavior normal.        Thought Content: Thought content normal.        Judgment: Judgment normal.     BP (!) 143/79   Pulse 81   Temp (!) 97.3 F (36.3 C) (Temporal)   Resp 16   Ht _0  (1.651 m)   Wt 291 lb 9.6 oz (132.3 kg)   LMP 07/17/2002 (Approximate)   SpO2 99%   BMI 48.52 kg/m  Wt Readings from Last 3 Encounters:  02/05/22 291 lb 9.6 oz (132.3 kg)  01/29/22  291 lb 9.6 oz (132.3 kg)  01/01/22 286 lb 9.6 oz (130 kg)     Health Maintenance  Topic Date Due   FOOT EXAM  Never done  OPHTHALMOLOGY EXAM  Never done   Diabetic kidney evaluation - Urine ACR  Never done   Zoster Vaccines- Shingrix (1 of 2) 04/03/2022 (Originally 08/08/2020)   HIV Screening  01/02/2023 (Originally 08/08/1985)   COVID-19 Vaccine (1) 01/20/2023 (Originally 02/09/1971)   HEMOGLOBIN A1C  04/25/2022   Diabetic kidney evaluation - GFR measurement  10/24/2022   MAMMOGRAM  12/18/2023   PAP SMEAR-Modifier  10/22/2024   COLONOSCOPY (Pts 45-78yr Insurance coverage will need to be confirmed)  02/05/2029   DTaP/Tdap/Td (2 - Td or Tdap) 10/23/2031   Hepatitis C Screening  Completed   HPV VACCINES  Aged Out   INFLUENZA VACCINE  Discontinued    There are no preventive care reminders to display for this patient.  Lab Results  Component Value Date   TSH 1.32 10/23/2021   Lab Results  Component Value Date   WBC 5.3 10/23/2021   HGB 12.7 10/23/2021   HCT 38.3 10/23/2021   MCV 92.5 10/23/2021   PLT 236 10/23/2021   Lab Results  Component Value Date   NA 138 10/23/2021   K 4.5 10/23/2021   CO2 22 10/23/2021   GLUCOSE 323 (H) 10/23/2021   BUN 19 10/23/2021   CREATININE 0.95 10/23/2021   BILITOT 1.1 10/23/2021   ALKPHOS 126 (H) 05/13/2012   AST 13 10/23/2021   ALT 11 10/23/2021   PROT 6.9 10/23/2021   ALBUMIN 3.0 (L) 05/13/2012   CALCIUM 8.9 10/23/2021   EGFR 73 10/23/2021   Lab Results  Component Value Date   CHOL 256 (H) 10/23/2021   Lab Results  Component Value Date   HDL 58 10/23/2021   Lab Results  Component Value Date   LDLCALC 171 (H) 10/23/2021   Lab Results  Component Value Date   TRIG 132 10/23/2021   Lab Results  Component Value Date   CHOLHDL 4.4 10/23/2021   Lab Results  Component Value Date   HGBA1C 11.9 (H) 10/23/2021      Assessment & Plan:   Problem List Items Addressed This Visit       Cardiovascular and Mediastinum    Primary hypertension    Patient BP  Vitals:   01/29/22 1106 01/29/22 1111  BP: (!) 148/89 (!) 143/79    in the office today. Encourage patient to consume low-salt heart healthy diet and follow regular exercise schedule. Started her on losartan HCTZ.   Encouraged patient to check blood pressure at home and bring readings to the next appointment      Relevant Medications   losartan-hydrochlorothiazide (HYZAAR) 50-12.5 MG tablet     Endocrine   Type 2 diabetes mellitus without complication, without long-term current use of insulin ([facility] [facility]    Her blood sugar 372 in the office today. Patient was not taking any medication for diabetes. Encouraged her to take medication, consume healthy diet and follow regular exercise schedule Sample provided for farxiga 10 mg. Started her on Ozempic and glipizide.      Relevant Medications   Semaglutide,0.25 or 0.5MG/DOS, (OZEMPIC, 0.25 OR 0.5 MG/DOSE,) 2 MG/3ML SOPN   losartan-hydrochlorothiazide (HYZAAR) 50-12.5 MG tablet   glipiZIDE (GLUCOTROL) 5 MG tablet   dapagliflozin propanediol (FARXIGA) 10 MG TABS tablet   Other Relevant Orders   POCT glucose (manual entry) (Completed)     Other   Leg swelling    Encouraged her to keep the feet elevated and wear compression stockings. If needed we will start her on medication        Meds ordered  this encounter  Medications   Semaglutide,0.25 or 0.5MG/DOS, (OZEMPIC, 0.25 OR 0.5 MG/DOSE,) 2 MG/3ML SOPN    Sig: Inject 0.25 mg into the skin once a week.    Dispense:  3 mL    Refill:  1   losartan-hydrochlorothiazide (HYZAAR) 50-12.5 MG tablet    Sig: Take 1 tablet by mouth daily.    Dispense:  90 tablet    Refill:  1   glipiZIDE (GLUCOTROL) 5 MG tablet    Sig: Take 1 tablet (5 mg total) by mouth 2 (two) times daily before a meal.    Dispense:  60 tablet    Refill:  3   dapagliflozin propanediol (FARXIGA) 10 MG TABS tablet    Sig: Take 1 tablet (10 mg total) by mouth daily before  breakfast.    Dispense:  28 tablet    Refill:  0    Order Specific Question:   Lot Number?    Answer:   [id number]    Order Specific Question:   Expiration Date?    Answer:   08/19/2024    Order Specific Question:   NDC    Answer:   1779-3903-00 [[id number]]    Order Specific Question:   Quantity    Answer:   28     Follow-up: No follow-ups on file.    Theresia Lo, NP

## 2022-02-01 ENCOUNTER — Other Ambulatory Visit: Payer: Self-pay | Admitting: Nurse Practitioner

## 2022-02-03 ENCOUNTER — Telehealth: Payer: Self-pay

## 2022-02-03 ENCOUNTER — Other Ambulatory Visit (HOSPITAL_COMMUNITY): Payer: Self-pay

## 2022-02-03 NOTE — Telephone Encounter (Signed)
Pharmacy Patient Advocate Encounter   Received notification from [facility] that prior authorization for Ozempic '2mg'$ /15m is required/requested.   PA submitted on 02/03/2022 [place]  Received notification that Authorization has been APPROVED from 02/03/2022 to 02/03/2023.

## 2022-02-04 ENCOUNTER — Ambulatory Visit: Payer: No Typology Code available for payment source | Admitting: Podiatry

## 2022-02-05 ENCOUNTER — Encounter
Admission: RE | Disposition: A | Discharge status: Home / Self Care | Payer: Self-pay | Source: Ambulatory Visit | Attending: Gastroenterology

## 2022-02-05 ENCOUNTER — Encounter: Payer: Self-pay | Admitting: Gastroenterology

## 2022-02-05 ENCOUNTER — Ambulatory Visit
Admission: RE | Admit: 2022-02-05 | Discharge: 2022-02-05 | Disposition: A | Discharge status: Home / Self Care | Payer: No Typology Code available for payment source | Source: Ambulatory Visit | Attending: Gastroenterology | Admitting: Gastroenterology

## 2022-02-05 ENCOUNTER — Ambulatory Visit: Payer: No Typology Code available for payment source | Admitting: Anesthesiology

## 2022-02-05 DIAGNOSIS — D126 Benign neoplasm of colon, unspecified: Secondary | ICD-10-CM

## 2022-02-05 DIAGNOSIS — D124 Benign neoplasm of descending colon: Secondary | ICD-10-CM | POA: Diagnosis not present

## 2022-02-05 DIAGNOSIS — M1990 Unspecified osteoarthritis, unspecified site: Secondary | ICD-10-CM | POA: Insufficient documentation

## 2022-02-05 DIAGNOSIS — K219 Gastro-esophageal reflux disease without esophagitis: Secondary | ICD-10-CM | POA: Insufficient documentation

## 2022-02-05 DIAGNOSIS — Z9049 Acquired absence of other specified parts of digestive tract: Secondary | ICD-10-CM | POA: Diagnosis not present

## 2022-02-05 DIAGNOSIS — Z7984 Long term (current) use of oral hypoglycemic drugs: Secondary | ICD-10-CM | POA: Diagnosis not present

## 2022-02-05 DIAGNOSIS — I10 Essential (primary) hypertension: Secondary | ICD-10-CM | POA: Diagnosis not present

## 2022-02-05 DIAGNOSIS — Z7985 Long-term (current) use of injectable non-insulin antidiabetic drugs: Secondary | ICD-10-CM | POA: Diagnosis not present

## 2022-02-05 DIAGNOSIS — E119 Type 2 diabetes mellitus without complications: Secondary | ICD-10-CM | POA: Diagnosis not present

## 2022-02-05 DIAGNOSIS — F17200 Nicotine dependence, unspecified, uncomplicated: Secondary | ICD-10-CM | POA: Diagnosis not present

## 2022-02-05 DIAGNOSIS — F17290 Nicotine dependence, other tobacco product, uncomplicated: Secondary | ICD-10-CM | POA: Diagnosis not present

## 2022-02-05 DIAGNOSIS — Z1211 Encounter for screening for malignant neoplasm of colon: Secondary | ICD-10-CM | POA: Diagnosis not present

## 2022-02-05 DIAGNOSIS — K635 Polyp of colon: Secondary | ICD-10-CM | POA: Diagnosis not present

## 2022-02-05 DIAGNOSIS — Z96642 Presence of left artificial hip joint: Secondary | ICD-10-CM | POA: Insufficient documentation

## 2022-02-05 HISTORY — PX: COLONOSCOPY WITH PROPOFOL: SHX5780

## 2022-02-05 LAB — GLUCOSE, CAPILLARY: Glucose-Capillary: 231 mg/dL — ABNORMAL HIGH (ref 70–99)

## 2022-02-05 SURGERY — COLONOSCOPY WITH PROPOFOL
Anesthesia: General

## 2022-02-05 MED ORDER — SODIUM CHLORIDE 0.9 % IV SOLN
INTRAVENOUS | Status: DC
  Administered 2022-02-05: 1000 mL via INTRAVENOUS

## 2022-02-05 MED ORDER — LIDOCAINE 2% (20 MG/ML) 5 ML SYRINGE
INTRAMUSCULAR | Status: DC | PRN
  Administered 2022-02-05: 50 mg via INTRAVENOUS

## 2022-02-05 MED ORDER — PROPOFOL 10 MG/ML IV BOLUS
INTRAVENOUS | Status: DC | PRN
  Administered 2022-02-05: 80 mg via INTRAVENOUS

## 2022-02-05 MED ORDER — PROPOFOL 500 MG/50ML IV EMUL
INTRAVENOUS | Status: DC | PRN
  Administered 2022-02-05: 200 ug/kg/min via INTRAVENOUS

## 2022-02-05 NOTE — Transfer of Care (Signed)
Immediate Anesthesia Transfer of Care Note  Patient: TENEA SENS  Procedure(s) Performed: COLONOSCOPY WITH PROPOFOL  Patient Location: Endoscopy Unit  Anesthesia Type:General  Level of Consciousness: drowsy  Airway & Oxygen Therapy: Patient Spontanous Breathing  Post-op Assessment: Report given to RN and Post -op Vital signs reviewed and stable  Post vital signs: Reviewed and stable  Last Vitals:  Vitals Value Taken Time  BP 124/65 02/05/22 1117  Temp    Pulse 73 02/05/22 1117  Resp 21 02/05/22 1117  SpO2 100 % 02/05/22 1117  Vitals shown include unvalidated device data.  Last Pain:  Vitals:   02/05/22 1034  TempSrc: Temporal  PainSc: 0-No pain         Complications: No notable events documented.

## 2022-02-05 NOTE — H&P (Signed)
Jonathon Bellows, MD [address] [place], [place], [place], [address] [address], [place], [place], [place], [address] Phone: [phone]  Fax: [phone]  Primary Care Physician:  Cletis Athens, MD   Pre-Procedure History & Physical: HPI:  Jessica Richardson is a 51 y.o. female is here for an colonoscopy.   Past Medical History:  Diagnosis Date   Arthritis    Depression    Diabetes mellitus    Hip arthritis, left, post-SCFE 03/29/2011    Past Surgical History:  Procedure Laterality Date   ABDOMINAL HYSTERECTOMY     APPENDECTOMY     CESAREAN SECTION     FRACTURE SURGERY     JOINT REPLACEMENT     pins both hips   TIBIA IM NAIL INSERTION Right 05/15/2012   Procedure: Open Reduction Internal Fixation Right Tibia;  Surgeon: Johnny Bridge, MD;  Location: [place];  Service: Orthopedics;  Laterality: Right;   TONSILLECTOMY     TOTAL HIP ARTHROPLASTY  03/29/2011   Procedure: TOTAL HIP ARTHROPLASTY;  Surgeon: Johnny Bridge;  Location: [place];  Service: Orthopedics;  Laterality: Left;  with removal of retained hardware (knowles pins)    Prior to Admission medications   Medication Sig Start Date End Date Taking? Authorizing Provider  blood glucose meter kit and supplies Dispense based on patient and insurance preference. Use up to four times daily as directed. (FOR ICD-10 E10.9, E11.9). 11/05/21  Yes Theresia Lo, NP  dapagliflozin propanediol (FARXIGA) 10 MG TABS tablet Take 1 tablet (10 mg total) by mouth daily before breakfast. 01/29/22  Yes Theresia Lo, NP  losartan-hydrochlorothiazide (HYZAAR) 50-12.5 MG tablet Take 1 tablet by mouth daily. 01/29/22  Yes Theresia Lo, NP  omeprazole (PRILOSEC) 40 MG capsule TAKE 1 CAPSULE (40 MG TOTAL) BY MOUTH DAILY. 02/01/22  Yes Theresia Lo, NP  dapagliflozin propanediol (FARXIGA) 5 MG TABS tablet Take 1 tablet (5 mg total) by mouth daily before breakfast. Patient not taking: Reported on 01/01/2022 12/15/21    Cletis Athens, MD  glipiZIDE (GLUCOTROL) 5 MG tablet Take 1 tablet (5 mg total) by mouth 2 (two) times daily before a meal. 01/29/22   Theresia Lo, NP  ibuprofen (ADVIL,MOTRIN) 600 MG tablet Take 1 tablet (600 mg total) by mouth every 6 (six) hours as needed. Patient taking differently: Take 600 mg by mouth every 6 (six) hours as needed for moderate pain. 07/01/18   Charlann Lange, PA-C  Semaglutide,0.25 or 0.5MG/DOS, (OZEMPIC, 0.25 OR 0.5 MG/DOSE,) 2 MG/3ML SOPN Inject 0.25 mg into the skin once a week. 01/29/22   Theresia Lo, NP    Allergies as of 11/05/2021 - Review Complete 11/05/2021  Allergen Reaction Noted   Penicillins Hives 03/12/2011    Family History  Problem Relation Age of Onset   Breast cancer Maternal Aunt     Social History   Socioeconomic History   Marital status: Single    Spouse name: Not on file   Number of children: Not on file   Years of education: Not on file   Highest education level: Not on file  Occupational History   Not on file  Tobacco Use   Smoking status: Every Day    Types: Cigars    Last attempt to quit: 03/24/2011    Years since quitting: 10.8   Smokeless tobacco: Never   Tobacco comments:    Trying to quit. There are days she does not smoke at all.   Vaping Use   Vaping Use: Never  used  Substance and Sexual Activity   Alcohol use: No    Comment: rarely   Drug use: No   Sexual activity: Never  Other Topics Concern   Not on file  Social History Narrative   Not on file   Social Determinants of Health   Financial Resource Strain: Not on file  Food Insecurity: Not on file  Transportation Needs: Not on file  Physical Activity: Not on file  Stress: Not on file  Social Connections: Not on file  Intimate Partner Violence: Not on file    Review of Systems: See HPI, otherwise negative ROS  Physical Exam: BP (!) 148/76   Pulse 80   Temp (!) 96.5 F (35.8 C) (Temporal)   Resp 18   Ht _0  (1.626 m)   Wt 132.3 kg    LMP 07/17/2002 (Approximate)   SpO2 98%   BMI 50.05 kg/m  General:   Alert,  pleasant and cooperative in NAD Head:  Normocephalic and atraumatic. Neck:  Supple; no masses or thyromegaly. Lungs:  Clear throughout to auscultation, normal respiratory effort.    Heart:  +S1, +S2, Regular rate and rhythm, No edema. Abdomen:  Soft, nontender and nondistended. Normal bowel sounds, without guarding, and without rebound.   Neurologic:  Alert and  oriented x4;  grossly normal neurologically.  Impression/Plan: Jessica Richardson is here for an colonoscopy to be performed for Screening colonoscopy average risk   Risks, benefits, limitations, and alternatives regarding  colonoscopy have been reviewed with the patient.  Questions have been answered.  All parties agreeable.   Jonathon Bellows, MD  02/05/2022, 10:45 AM

## 2022-02-05 NOTE — Op Note (Signed)
[facility] Gastroenterology Patient Name: Jessica Richardson Procedure Date: 02/05/2022 10:56 AM MRN: [id number] Account #: [id number] Date of Birth: June 08, 1970 Admit Type: Outpatient Age: 51 Room: [facility] ENDO ROOM 4 Gender: Female Note Status: Finalized Instrument Name: Jasper Riling [id number] Procedure:             Colonoscopy Indications:           Screening for colorectal malignant neoplasm Providers:             Jonathon Bellows MD, MD Referring MD:          Cletis Athens, MD (Referring MD) Medicines:             Monitored Anesthesia Care Complications:         No immediate complications. Procedure:             Pre-Anesthesia Assessment:                        - Prior to the procedure, a History and Physical was                         performed, and patient medications, allergies and                         sensitivities were reviewed. The patient's tolerance                         of previous anesthesia was reviewed.                        - The risks and benefits of the procedure and the                         sedation options and risks were discussed with the                         patient. All questions were answered and informed                         consent was obtained.                        - ASA Grade Assessment: II - A patient with mild                         systemic disease.                        After obtaining informed consent, the colonoscope was                         passed under direct vision. Throughout the procedure,                         the patient's blood pressure, pulse, and oxygen                         saturations were monitored continuously. The                         Colonoscope was introduced  through the anus and                         advanced to the the cecum, identified by the                         appendiceal orifice. The colonoscopy was performed                         with ease. The patient tolerated the procedure well.                          The quality of the bowel preparation was excellent.                         The appendiceal orifice was photographed. Findings:      The perianal and digital rectal examinations were normal.      A 5 mm polyp was found in the descending colon. The polyp was sessile.       The polyp was removed with a cold snare. Resection and retrieval were       complete.      The exam was otherwise without abnormality on direct and retroflexion       views. Impression:            - One 5 mm polyp in the descending colon, removed with                         a cold snare. Resected and retrieved.                        - The examination was otherwise normal on direct and                         retroflexion views. Recommendation:        - Discharge patient to home (with escort).                        - Resume previous diet.                        - Continue present medications.                        - Await pathology results.                        - Repeat colonoscopy for surveillance based on                         pathology results. Procedure Code(s):     --- Professional ---                        [phone], Colonoscopy, flexible; with removal of                         tumor(s), polyp(s), or other lesion(s) by snare                         technique Diagnosis Code(s):     ---  Professional ---                        Z12.11, Encounter for screening for malignant neoplasm                         of colon                        D12.4, Benign neoplasm of descending colon CPT copyright 2022 American Medical Association. All rights reserved. The codes documented in this report are preliminary and upon coder review may  be revised to meet current compliance requirements. Jonathon Bellows, MD Jonathon Bellows MD, MD 02/05/2022 11:14:19 AM This report has been signed electronically. Number of Addenda: 0 Note Initiated On: 02/05/2022 10:56 AM Scope Withdrawal Time: 0 hours 8 minutes 20  seconds  Total Procedure Duration: 0 hours 11 minutes 36 seconds  Estimated Blood Loss:  Estimated blood loss: none.      [facility]

## 2022-02-05 NOTE — Anesthesia Preprocedure Evaluation (Addendum)
Anesthesia Evaluation  Patient identified by MRN, date of birth, ID band Patient awake    Reviewed: Allergy & Precautions, NPO status , Patient's Chart, lab work & pertinent test results  History of Anesthesia Complications Negative for: history of anesthetic complications  Airway Mallampati: III  TM Distance: >3 FB Neck ROM: full    Dental  (+) Chipped, Upper Dentures   Pulmonary neg shortness of breath, Current Smoker and Patient abstained from smoking.   Pulmonary exam normal        Cardiovascular Exercise Tolerance: Good hypertension, (-) angina Normal cardiovascular exam     Neuro/Psych negative neurological ROS  negative psych ROS   GI/Hepatic Neg liver ROS,GERD  Controlled,,  Endo/Other  diabetes, Type 2    Renal/GU negative Renal ROS  negative genitourinary   Musculoskeletal   Abdominal   Peds  Hematology negative hematology ROS (+)   Anesthesia Other Findings Past Medical History: No date: Arthritis No date: Depression No date: Diabetes mellitus 03/29/2011: Hip arthritis, left, post-SCFE  Past Surgical History: No date: ABDOMINAL HYSTERECTOMY No date: APPENDECTOMY No date: CESAREAN SECTION No date: JOINT REPLACEMENT     Comment:  pins both hips 05/15/2012: TIBIA IM NAIL INSERTION; Right     Comment:  Procedure: Open Reduction Internal Fixation Right Tibia;              Surgeon: Johnny Bridge, MD;  Location: [place];  Service:              Orthopedics;  Laterality: Right; No date: TONSILLECTOMY 03/29/2011: TOTAL HIP ARTHROPLASTY     Comment:  Procedure: TOTAL HIP ARTHROPLASTY;  Surgeon: Johnny Bridge;  Location: [place];  Service: Orthopedics;                Laterality: Left;  with removal of retained hardware               (knowles pins)     Reproductive/Obstetrics negative OB ROS                             Anesthesia Physical Anesthesia Plan  ASA:  3  Anesthesia Plan: General   Post-op Pain Management:    Induction: Intravenous  PONV Risk Score and Plan: Propofol infusion and TIVA  Airway Management Planned: Natural Airway and Nasal Cannula  Additional Equipment:   Intra-op Plan:   Post-operative Plan:   Informed Consent: I have reviewed the patients History and Physical, chart, labs and discussed the procedure including the risks, benefits and alternatives for the proposed anesthesia with the patient or authorized representative who has indicated his/her understanding and acceptance.     Dental Advisory Given  Plan Discussed with: Anesthesiologist, CRNA and Surgeon  Anesthesia Plan Comments: (Patient consented for risks of anesthesia including but not limited to:  - adverse reactions to medications - risk of airway placement if required - damage to eyes, teeth, lips or other oral mucosa - nerve damage due to positioning  - sore throat or hoarseness - Damage to heart, brain, nerves, lungs, other parts of body or loss of life  Patient voiced understanding.)       Anesthesia Quick Evaluation

## 2022-02-05 NOTE — Anesthesia Postprocedure Evaluation (Signed)
Anesthesia Post Note  Patient: Jessica Richardson  Procedure(s) Performed: COLONOSCOPY WITH PROPOFOL  Patient location during evaluation: Endoscopy Anesthesia Type: General Level of consciousness: awake and alert Pain management: pain level controlled Vital Signs Assessment: post-procedure vital signs reviewed and stable Respiratory status: spontaneous breathing, nonlabored ventilation, respiratory function stable and patient connected to nasal cannula oxygen Cardiovascular status: blood pressure returned to baseline and stable Postop Assessment: no apparent nausea or vomiting Anesthetic complications: no   No notable events documented.   Last Vitals:  Vitals:   02/05/22 1127 02/05/22 1137  BP: 98/67 129/82  Pulse: 76 67  Resp: 15 10  Temp:    SpO2: 100% 100%    Last Pain:  Vitals:   02/05/22 1137  TempSrc:   PainSc: 0-No pain                 Precious Haws [name]

## 2022-02-08 ENCOUNTER — Encounter: Payer: Self-pay | Admitting: Gastroenterology

## 2022-02-08 LAB — SURGICAL PATHOLOGY

## 2022-02-09 ENCOUNTER — Ambulatory Visit (INDEPENDENT_AMBULATORY_CARE_PROVIDER_SITE_OTHER): Payer: No Typology Code available for payment source | Admitting: Podiatry

## 2022-02-09 DIAGNOSIS — M79675 Pain in left toe(s): Secondary | ICD-10-CM

## 2022-02-09 DIAGNOSIS — B351 Tinea unguium: Secondary | ICD-10-CM

## 2022-02-09 DIAGNOSIS — E119 Type 2 diabetes mellitus without complications: Secondary | ICD-10-CM

## 2022-02-09 DIAGNOSIS — M79674 Pain in right toe(s): Secondary | ICD-10-CM

## 2022-02-09 NOTE — Progress Notes (Signed)
  Subjective:  Patient ID: Jessica Richardson, female    DOB: Oct 10, 1970,  MRN: [id number]  Chief Complaint  Patient presents with   Diabetes   51 y.o. female returns for the above complaint.  Patient presents with thickened dystrophic toenails x10 mild pain on palpation.  She is noted to be down herself.  She would like for me to do it denies any other acute complaints.  Objective:  There were no vitals filed for this visit. Podiatric Exam: Vascular: dorsalis pedis and posterior tibial pulses are palpable bilateral. Capillary return is immediate. Temperature gradient is WNL. Skin turgor WNL  Sensorium: Normal Semmes Weinstein monofilament test. Normal tactile sensation bilaterally. Nail Exam: Pt has thick disfigured discolored nails with subungual debris noted bilateral entire nail hallux through fifth toenails.  Pain on palpation to the nails. Ulcer Exam: There is no evidence of ulcer or pre-ulcerative changes or infection. Orthopedic Exam: Muscle tone and strength are WNL. No limitations in general ROM. No crepitus or effusions noted.  Skin: No Porokeratosis. No infection or ulcers    Assessment & Plan:   1. Pain due to onychomycosis of toenails of both feet   2. Type 2 diabetes mellitus without complication, without long-term current use of insulin ([place])     Patient was evaluated and treated and all questions answered.  Onychomycosis with pain  -Nails palliatively debrided as below. -Educated on self-care  Procedure: Nail Debridement Rationale: pain  Type of Debridement: manual, sharp debridement. Instrumentation: Nail nipper, rotary burr. Number of Nails: 10  Procedures and Treatment: Consent by patient was obtained for treatment procedures. The patient understood the discussion of treatment and procedures well. All questions were answered thoroughly reviewed. Debridement of mycotic and hypertrophic toenails, 1 through 5 bilateral and clearing of subungual debris. No  ulceration, no infection noted.  Return Visit-Office Procedure: Patient instructed to return to the office for a follow up visit 3 months for continued evaluation and treatment.  Boneta Lucks, DPM    No follow-ups on file.

## 2022-02-18 ENCOUNTER — Encounter: Payer: Self-pay | Admitting: Nurse Practitioner

## 2022-02-18 NOTE — Assessment & Plan Note (Signed)
Patient BP  Vitals:   01/29/22 1106 01/29/22 1111  BP: (!) 148/89 (!) 143/79    in the office today. Encourage patient to consume low-salt heart healthy diet and follow regular exercise schedule. Started her on losartan HCTZ.   Encouraged patient to check blood pressure at home and bring readings to the next appointment

## 2022-02-18 NOTE — Assessment & Plan Note (Signed)
Encouraged her to keep the feet elevated and wear compression stockings. If needed we will start her on medication

## 2022-03-18 DIAGNOSIS — E119 Type 2 diabetes mellitus without complications: Secondary | ICD-10-CM | POA: Diagnosis not present

## 2022-04-02 ENCOUNTER — Encounter: Payer: Self-pay | Admitting: Nurse Practitioner

## 2022-04-02 ENCOUNTER — Telehealth: Payer: Self-pay

## 2022-04-02 ENCOUNTER — Ambulatory Visit: Payer: No Typology Code available for payment source | Admitting: Nurse Practitioner

## 2022-04-02 VITALS — BP 128/78 | HR 77 | Temp 97.7°F | Ht 64.0 in | Wt 285.0 lb

## 2022-04-02 DIAGNOSIS — E119 Type 2 diabetes mellitus without complications: Secondary | ICD-10-CM | POA: Diagnosis not present

## 2022-04-02 DIAGNOSIS — I10 Essential (primary) hypertension: Secondary | ICD-10-CM | POA: Diagnosis not present

## 2022-04-02 DIAGNOSIS — R600 Localized edema: Secondary | ICD-10-CM

## 2022-04-02 LAB — GLUCOSE, POCT (MANUAL RESULT ENTRY): POC Glucose: 155 mg/dl — AB (ref 70–99)

## 2022-04-02 MED ORDER — BLOOD GLUCOSE METER KIT: PACK | 0 refills | Status: AC

## 2022-04-02 MED ORDER — OZEMPIC (0.25 OR 0.5 MG/DOSE) 2 MG/3ML ~~LOC~~ SOPN: 0.5000 mg | PEN_INJECTOR | SUBCUTANEOUS | 2 refills | Status: DC

## 2022-04-02 NOTE — Progress Notes (Signed)
Continue losartan  Established Patient Office Visit  Subjective:  Patient ID: STANLEY HELMUTH, female    DOB: 11-Aug-1970  Age: 52 y.o. MRN: [id number]  CC:  Chief Complaint  Patient presents with   Establish Care    Transfer of Care     HPI  KELBIE MORO presents for diabetes follow up. Her fasting BS number running around 112- 120. She is taking glipizide, ozempic and farxiga (sample). She is trying to eat more healthy. She has stopped drinking sodas completely, cut down on sweets and some exercise.  Patient states that she is not able to take losartan during the day time, that makes her dizzy  and takes losartan HCTZ at night. Her insurance does not covers Losartan alone but combination with HCTZ.   She states that the leg swelling has improved a lot and she is taking hydrochlorothiazide 50 mg daily.  She has cut down on smoking and smokes 2 cigarettes in a week.  Patient said she is stressed these days because her family member is in the hospital.  She also works 7 days a week and does not get enough rest.  HPI   Past Medical History:  Diagnosis Date   Arthritis    Depression    Diabetes mellitus    Hip arthritis, left, post-SCFE 03/29/2011    Past Surgical History:  Procedure Laterality Date   ABDOMINAL HYSTERECTOMY     APPENDECTOMY     CESAREAN SECTION     COLONOSCOPY WITH PROPOFOL N/A 02/05/2022   Procedure: COLONOSCOPY WITH PROPOFOL;  Surgeon: Jonathon Bellows, MD;  Location: [facility] ENDOSCOPY;  Service: Gastroenterology;  Laterality: N/A;   FRACTURE SURGERY     JOINT REPLACEMENT     pins both hips   TIBIA IM NAIL INSERTION Right 05/15/2012   Procedure: Open Reduction Internal Fixation Right Tibia;  Surgeon: Johnny Bridge, MD;  Location: [place];  Service: Orthopedics;  Laterality: Right;   TONSILLECTOMY     TOTAL HIP ARTHROPLASTY  03/29/2011   Procedure: TOTAL HIP ARTHROPLASTY;  Surgeon: Johnny Bridge;  Location: [place];  Service: Orthopedics;  Laterality: Left;   with removal of retained hardware (knowles pins)    Family History  Problem Relation Age of Onset   Breast cancer Maternal Aunt     Social History   Socioeconomic History   Marital status: Single    Spouse name: Not on file   Number of children: Not on file   Years of education: Not on file   Highest education level: Not on file  Occupational History   Not on file  Tobacco Use   Smoking status: Every Day    Types: Cigars    Last attempt to quit: 03/24/2011    Years since quitting: 11.0   Smokeless tobacco: Never   Tobacco comments:    Trying to quit. There are days she does not smoke at all. She smokes two cigarettes a week.   Vaping Use   Vaping Use: Never used  Substance and Sexual Activity   Alcohol use: No    Comment: rarely   Drug use: No   Sexual activity: Never  Other Topics Concern   Not on file  Social History Narrative   Not on file   Social Determinants of Health   Financial Resource Strain: Not on file  Food Insecurity: Not on file  Transportation Needs: Not on file  Physical Activity: Not on file  Stress: Not on file  Social Connections: Not on  file  Intimate Partner Violence: Not on file     Outpatient Medications Prior to Visit  Medication Sig Dispense Refill   dapagliflozin propanediol (FARXIGA) 5 MG TABS tablet Take 1 tablet (5 mg total) by mouth daily before breakfast. 30 tablet 1   glipiZIDE (GLUCOTROL) 5 MG tablet Take 1 tablet (5 mg total) by mouth 2 (two) times daily before a meal. 60 tablet 3   hydrochlorothiazide (HYDRODIURIL) 25 MG tablet Take 25 mg by mouth daily.     ibuprofen (ADVIL,MOTRIN) 600 MG tablet Take 1 tablet (600 mg total) by mouth every 6 (six) hours as needed. (Patient taking differently: Take 600 mg by mouth every 6 (six) hours as needed for moderate pain.) 30 tablet 0   losartan-hydrochlorothiazide (HYZAAR) 50-12.5 MG tablet Take 1 tablet by mouth daily. 90 tablet 1   omeprazole (PRILOSEC) 40 MG capsule TAKE 1 CAPSULE  (40 MG TOTAL) BY MOUTH DAILY. 90 capsule 1   blood glucose meter kit and supplies Dispense based on patient and insurance preference. Use up to four times daily as directed. (FOR ICD-10 E10.9, E11.9). 1 each 0   Semaglutide,0.25 or 0.'5MG'$ /DOS, (OZEMPIC, 0.25 OR 0.5 MG/DOSE,) 2 MG/3ML SOPN Inject 0.25 mg into the skin once a week. 3 mL 1   dapagliflozin propanediol (FARXIGA) 10 MG TABS tablet Take 1 tablet (10 mg total) by mouth daily before breakfast. 28 tablet 0   No facility-administered medications prior to visit.    Allergies  Allergen Reactions   Metformin And Related     GI Upset   Penicillins Hives    ROS Review of Systems  Constitutional:  Positive for fatigue.  HENT: Negative.    Eyes: Negative.   Respiratory:  Negative for chest tightness and shortness of breath.   Cardiovascular:  Positive for leg swelling.  Gastrointestinal: Negative.   Genitourinary: Negative.   Musculoskeletal: Negative.   Neurological: Negative.   Psychiatric/Behavioral: Negative.        Objective:    Physical Exam Constitutional:      Appearance: Normal appearance. She is obese.  HENT:     Head: Normocephalic.     Right Ear: Tympanic membrane normal.     Left Ear: Tympanic membrane normal.     Nose: Nose normal.     Mouth/Throat:     Mouth: Mucous membranes are moist.     Pharynx: Oropharynx is clear.  Eyes:     Extraocular Movements: Extraocular movements intact.     Conjunctiva/sclera: Conjunctivae normal.     Pupils: Pupils are equal, round, and reactive to light.  Cardiovascular:     Rate and Rhythm: Normal rate and regular rhythm.     Pulses: Normal pulses.     Heart sounds: Normal heart sounds.  Pulmonary:     Effort: Pulmonary effort is normal. No respiratory distress.     Breath sounds: Normal breath sounds. No rhonchi.  Abdominal:     General: Bowel sounds are normal.     Palpations: Abdomen is soft. There is no mass.     Tenderness: There is no abdominal tenderness.      Hernia: No hernia is present.  Musculoskeletal:        General: Swelling (right ankel> left ankel) present.     Cervical back: Neck supple. No tenderness.  Skin:    General: Skin is warm.     Capillary Refill: Capillary refill takes less than 2 seconds.  Neurological:     General: No focal deficit present.  Mental Status: She is alert and oriented to person, place, and time. Mental status is at baseline.  Psychiatric:        Mood and Affect: Mood normal.        Behavior: Behavior normal.        Thought Content: Thought content normal.        Judgment: Judgment normal.     BP 128/78   Pulse 77   Temp 97.7 F (36.5 C)   Ht '5\' 4"'$  (1.626 m)   Wt 285 lb (129.3 kg)   LMP 07/17/2002 (Approximate)   SpO2 98%   BMI 48.92 kg/m  Wt Readings from Last 3 Encounters:  04/02/22 285 lb (129.3 kg)  02/05/22 291 lb 9.6 oz (132.3 kg)  01/29/22 291 lb 9.6 oz (132.3 kg)     Health Maintenance  Topic Date Due   FOOT EXAM  Never done   OPHTHALMOLOGY EXAM  Never done   Diabetic kidney evaluation - Urine ACR  Never done   Zoster Vaccines- Shingrix (1 of 2) Never done   HIV Screening  01/02/2023 (Originally 08/08/1985)   COVID-19 Vaccine (1) 01/20/2023 (Originally 02/09/1971)   HEMOGLOBIN A1C  10/05/2022   Diabetic kidney evaluation - eGFR measurement  10/24/2022   MAMMOGRAM  12/18/2023   PAP SMEAR-Modifier  10/22/2024   COLONOSCOPY (Pts 45-50yr Insurance coverage will need to be confirmed)  02/05/2029   DTaP/Tdap/Td (2 - Td or Tdap) 10/23/2031   Hepatitis C Screening  Completed   HPV VACCINES  Aged Out   INFLUENZA VACCINE  Discontinued    There are no preventive care reminders to display for this patient.  Lab Results  Component Value Date   TSH 1.32 10/23/2021   Lab Results  Component Value Date   WBC 5.3 10/23/2021   HGB 12.7 10/23/2021   HCT 38.3 10/23/2021   MCV 92.5 10/23/2021   PLT 236 10/23/2021   Lab Results  Component Value Date   NA 138 10/23/2021   K 4.5  10/23/2021   CO2 22 10/23/2021   GLUCOSE 323 (H) 10/23/2021   BUN 19 10/23/2021   CREATININE 0.95 10/23/2021   BILITOT 1.1 10/23/2021   ALKPHOS 126 (H) 05/13/2012   AST 13 10/23/2021   ALT 11 10/23/2021   PROT 6.9 10/23/2021   ALBUMIN 3.0 (L) 05/13/2012   CALCIUM 8.9 10/23/2021   EGFR 73 10/23/2021   Lab Results  Component Value Date   CHOL 256 (H) 10/23/2021   Lab Results  Component Value Date   HDL 58 10/23/2021   Lab Results  Component Value Date   LDLCALC 171 (H) 10/23/2021   Lab Results  Component Value Date   TRIG 132 10/23/2021   Lab Results  Component Value Date   CHOLHDL 4.4 10/23/2021   Lab Results  Component Value Date   HGBA1C 9.4 (H) 04/06/2022      Assessment & Plan:   Problem List Items Addressed This Visit       Cardiovascular and Mediastinum   Primary hypertension - Primary    Blood pressure 128/78 in office today. Encourage patient to consume low-salt heart healthy diet and follow regular exercise schedule. Continue losartan, HCTZ.      Relevant Medications   hydrochlorothiazide (HYDRODIURIL) 25 MG tablet     Endocrine   Type 2 diabetes mellitus without complication, without long-term current use of insulin ([facility])    Her BS 155 in the office today and previous hemoglobin A1c 11.9 on 10/23/2021. Advised patient to  continue glipizide 5 mg, and Ozempic 0.5 mg subcu weekly. Will check hemoglobin A1c      Relevant Medications   Semaglutide,0.25 or 0.'5MG'$ /DOS, (OZEMPIC, 0.25 OR 0.5 MG/DOSE,) 2 MG/3ML SOPN   blood glucose meter kit and supplies   Other Relevant Orders   POCT Glucose (CBG) (Completed)   HgB A1c (Completed)     Other   Lower leg edema    Lower leg edema improved with medication. Encourage patient to use compression stockings and elevate the feet when possible.        Meds ordered this encounter  Medications   Semaglutide,0.25 or 0.'5MG'$ /DOS, (OZEMPIC, 0.25 OR 0.5 MG/DOSE,) 2 MG/3ML SOPN    Sig: Inject 0.5 mg into the  skin once a week.    Dispense:  9 mL    Refill:  2   blood glucose meter kit and supplies    Sig: Dispense based on patient and insurance preference. Use up to four times daily as directed. (FOR ICD-10 E10.9, E11.9).    Dispense:  1 each    Refill:  0    Order Specific Question:   Number of strips    Answer:   100    Order Specific Question:   Number of lancets    Answer:   100     Follow-up: No follow-ups on file.    Theresia Lo, NP

## 2022-04-02 NOTE — Telephone Encounter (Signed)
Spoke to Patient due to receiving error message on poc A1c and got her scheduled for Tuesday 04/06/22 at 9:45 for a lab draw A1c. Lab A1c order is placed.

## 2022-04-06 ENCOUNTER — Other Ambulatory Visit (INDEPENDENT_AMBULATORY_CARE_PROVIDER_SITE_OTHER): Payer: No Typology Code available for payment source

## 2022-04-06 DIAGNOSIS — E119 Type 2 diabetes mellitus without complications: Secondary | ICD-10-CM

## 2022-04-06 LAB — HEMOGLOBIN A1C: Hgb A1c MFr Bld: 9.4 % — ABNORMAL HIGH (ref 4.6–6.5)

## 2022-04-07 NOTE — Assessment & Plan Note (Signed)
Blood pressure 128/78 in office today. Encourage patient to consume low-salt heart healthy diet and follow regular exercise schedule. Continue losartan, HCTZ.

## 2022-04-07 NOTE — Assessment & Plan Note (Signed)
Her BS 155 in the office today and previous hemoglobin A1c 11.9 on 10/23/2021. Advised patient to continue glipizide 5 mg, and Ozempic 0.5 mg subcu weekly. Will check hemoglobin A1c

## 2022-04-07 NOTE — Assessment & Plan Note (Signed)
Lower leg edema improved with medication. Encourage patient to use compression stockings and elevate the feet when possible.

## 2022-04-08 NOTE — Progress Notes (Signed)
Please call patient with the result. HgA1c improved from 11.9 to 9.4 Continue the diet, exercise and medication.

## 2022-05-05 ENCOUNTER — Other Ambulatory Visit: Payer: Self-pay | Admitting: Nurse Practitioner

## 2022-07-08 ENCOUNTER — Ambulatory Visit: Payer: No Typology Code available for payment source | Admitting: Nurse Practitioner

## 2022-07-08 ENCOUNTER — Encounter: Payer: Self-pay | Admitting: Nurse Practitioner

## 2022-07-08 VITALS — BP 128/82 | HR 83 | Temp 97.9°F | Ht 64.0 in | Wt 283.0 lb

## 2022-07-08 DIAGNOSIS — E119 Type 2 diabetes mellitus without complications: Secondary | ICD-10-CM | POA: Diagnosis not present

## 2022-07-08 DIAGNOSIS — I10 Essential (primary) hypertension: Secondary | ICD-10-CM | POA: Diagnosis not present

## 2022-07-08 DIAGNOSIS — E785 Hyperlipidemia, unspecified: Secondary | ICD-10-CM

## 2022-07-08 MED ORDER — OZEMPIC (0.25 OR 0.5 MG/DOSE) 2 MG/3ML ~~LOC~~ SOPN: 0.5000 mg | PEN_INJECTOR | SUBCUTANEOUS | 2 refills | Status: DC

## 2022-07-08 NOTE — Progress Notes (Signed)
Established Patient Office Visit  Subjective:  Patient ID: Jessica Richardson, female    DOB: Mar 05, 1971  Age: 52 y.o. MRN: [id number]  CC:  Chief Complaint  Patient presents with   Medical Management of Chronic Issues     HPI  Jessica Richardson presents for chronic disease management.  Diabetes: : She checks her BS at home at the reading is between 180-190. She is on glipizide and ozempic.  Trying to eat healthy.  Eye examination : Jan 2024, exam was reassuring.  Hypertension: Home blood pressure readings systolic in 120s and diastolic in 80s.  She is taking losartan/HCTZ daily.  Hyperlipidemia: Diet controlled.  Last total cholesterol 256, LDL 171, Triggs 132 and HDL 58 on 10/23/2021  She reports that her burp smells like sulfur from last 3 weeks.  Denies any abdominal pain, diarrhea, constipation, nausea vomiting.  HPI   Past Medical History:  Diagnosis Date   Arthritis    Depression    Diabetes mellitus    Hip arthritis, left, post-SCFE 03/29/2011    Past Surgical History:  Procedure Laterality Date   ABDOMINAL HYSTERECTOMY     APPENDECTOMY     CESAREAN SECTION     COLONOSCOPY WITH PROPOFOL N/A 02/05/2022   Procedure: COLONOSCOPY WITH PROPOFOL;  Surgeon: Wyline Mood, MD;  Location: [facility] ENDOSCOPY;  Service: Gastroenterology;  Laterality: N/A;   FRACTURE SURGERY     JOINT REPLACEMENT     pins both hips   TIBIA IM NAIL INSERTION Right 05/15/2012   Procedure: Open Reduction Internal Fixation Right Tibia;  Surgeon: Eulas Post, MD;  Location: [facility] OR;  Service: Orthopedics;  Laterality: Right;   TONSILLECTOMY     TOTAL HIP ARTHROPLASTY  03/29/2011   Procedure: TOTAL HIP ARTHROPLASTY;  Surgeon: Eulas Post;  Location: [facility] OR;  Service: Orthopedics;  Laterality: Left;  with removal of retained hardware (knowles pins)    Family History  Problem Relation Age of Onset   Breast cancer Maternal Aunt     Social History   Socioeconomic History   Marital status:  Single    Spouse name: Not on file   Number of children: Not on file   Years of education: Not on file   Highest education level: Not on file  Occupational History   Not on file  Tobacco Use   Smoking status: Every Day    Types: Cigars    Last attempt to quit: 03/24/2011    Years since quitting: 11.3   Smokeless tobacco: Never   Tobacco comments:    Trying to quit. There are days she does not smoke at all. She smokes two cigarettes a week.   Vaping Use   Vaping Use: Never used  Substance and Sexual Activity   Alcohol use: No    Comment: rarely   Drug use: No   Sexual activity: Never  Other Topics Concern   Not on file  Social History Narrative   Not on file   Social Determinants of Health   Financial Resource Strain: Not on file  Food Insecurity: Not on file  Transportation Needs: Not on file  Physical Activity: Not on file  Stress: Not on file  Social Connections: Not on file  Intimate Partner Violence: Not on file     Outpatient Medications Prior to Visit  Medication Sig Dispense Refill   blood glucose meter kit and supplies Dispense based on patient and insurance preference. Use up to four times daily as directed. (FOR ICD-10 E10.9,  E11.9). 1 each 0   glipiZIDE (GLUCOTROL) 5 MG tablet TAKE 1 TABLET (5 MG TOTAL) BY MOUTH TWICE A DAY BEFORE MEALS 180 tablet 1   losartan-hydrochlorothiazide (HYZAAR) 50-12.5 MG tablet Take 1 tablet by mouth daily. 90 tablet 1   omeprazole (PRILOSEC) 40 MG capsule TAKE 1 CAPSULE (40 MG TOTAL) BY MOUTH DAILY. 90 capsule 1   dapagliflozin propanediol (FARXIGA) 5 MG TABS tablet Take 1 tablet (5 mg total) by mouth daily before breakfast. 30 tablet 1   hydrochlorothiazide (HYDRODIURIL) 25 MG tablet Take 25 mg by mouth daily.     Semaglutide,0.25 or 0.5MG /DOS, (OZEMPIC, 0.25 OR 0.5 MG/DOSE,) 2 MG/3ML SOPN Inject 0.5 mg into the skin once a week. 9 mL 2   ibuprofen (ADVIL,MOTRIN) 600 MG tablet Take 1 tablet (600 mg total) by mouth every 6 (six)  hours as needed. (Patient not taking: Reported on 07/08/2022) 30 tablet 0   No facility-administered medications prior to visit.    Allergies  Allergen Reactions   Metformin And Related     GI Upset   Penicillins Hives    ROS Review of Systems  Constitutional: Negative.   HENT: Negative.    Eyes: Negative.   Respiratory:  Negative for chest tightness and shortness of breath.   Cardiovascular:  Negative for leg swelling.  Gastrointestinal:        But smells like sulfur  Genitourinary: Negative.   Musculoskeletal:        Injury to right hand  Neurological: Negative.   Psychiatric/Behavioral: Negative.        Objective:    Physical Exam Constitutional:      Appearance: Normal appearance. She is obese.  HENT:     Head: Normocephalic.     Right Ear: Tympanic membrane normal.     Left Ear: Tympanic membrane normal.     Nose: Nose normal.     Mouth/Throat:     Mouth: Mucous membranes are moist.     Pharynx: Oropharynx is clear.  Eyes:     Extraocular Movements: Extraocular movements intact.     Conjunctiva/sclera: Conjunctivae normal.     Pupils: Pupils are equal, round, and reactive to light.  Cardiovascular:     Rate and Rhythm: Normal rate and regular rhythm.     Pulses: Normal pulses.     Heart sounds: Normal heart sounds.  Pulmonary:     Effort: Pulmonary effort is normal. No respiratory distress.     Breath sounds: Normal breath sounds. No rhonchi.  Abdominal:     General: Bowel sounds are normal.     Palpations: Abdomen is soft. There is no mass.     Tenderness: There is no abdominal tenderness.     Hernia: No hernia is present.  Musculoskeletal:        General: Tenderness (Right hand: splint) present. No deformity or signs of injury.     Cervical back: Neck supple. No tenderness.  Skin:    General: Skin is warm.     Capillary Refill: Capillary refill takes less than 2 seconds.  Neurological:     General: No focal deficit present.     Mental Status:  She is alert and oriented to person, place, and time. Mental status is at baseline.  Psychiatric:        Mood and Affect: Mood normal.        Behavior: Behavior normal.        Thought Content: Thought content normal.        Judgment:  Judgment normal.     BP 128/82   Pulse 83   Temp 97.9 F (36.6 C)   Ht  (1.626 m)   Wt 283 lb (128.4 kg)   LMP 07/17/2002 (Approximate)   SpO2 98%   BMI 48.58 kg/m  Wt Readings from Last 3 Encounters:  07/08/22 283 lb (128.4 kg)  04/02/22 285 lb (129.3 kg)  02/05/22 291 lb 9.6 oz (132.3 kg)     Health Maintenance  Topic Date Due   FOOT EXAM  Never done   OPHTHALMOLOGY EXAM  Never done   Diabetic kidney evaluation - Urine ACR  Never done   Zoster Vaccines- Shingrix (1 of 2) Never done   HIV Screening  01/02/2023 (Originally 08/08/1985)   COVID-19 Vaccine (1) 01/20/2023 (Originally 02/09/1971)   HEMOGLOBIN A1C  10/05/2022   Diabetic kidney evaluation - eGFR measurement  10/24/2022   MAMMOGRAM  12/18/2023   PAP SMEAR-Modifier  10/22/2024   COLONOSCOPY (Pts 45-53yrs Insurance coverage will need to be confirmed)  02/05/2029   DTaP/Tdap/Td (2 - Td or Tdap) 10/23/2031   Hepatitis C Screening  Completed   HPV VACCINES  Aged Out   INFLUENZA VACCINE  Discontinued    There are no preventive care reminders to display for this patient.  Lab Results  Component Value Date   TSH 1.32 10/23/2021   Lab Results  Component Value Date   WBC 5.3 10/23/2021   HGB 12.7 10/23/2021   HCT 38.3 10/23/2021   MCV 92.5 10/23/2021   PLT 236 10/23/2021   Lab Results  Component Value Date   NA 138 10/23/2021   K 4.5 10/23/2021   CO2 22 10/23/2021   GLUCOSE 323 (H) 10/23/2021   BUN 19 10/23/2021   CREATININE 0.95 10/23/2021   BILITOT 1.1 10/23/2021   ALKPHOS 126 (H) 05/13/2012   AST 13 10/23/2021   ALT 11 10/23/2021   PROT 6.9 10/23/2021   ALBUMIN 3.0 (L) 05/13/2012   CALCIUM 8.9 10/23/2021   EGFR 73 10/23/2021   Lab Results  Component Value  Date   CHOL 256 (H) 10/23/2021   Lab Results  Component Value Date   HDL 58 10/23/2021   Lab Results  Component Value Date   LDLCALC 171 (H) 10/23/2021   Lab Results  Component Value Date   TRIG 132 10/23/2021   Lab Results  Component Value Date   CHOLHDL 4.4 10/23/2021   Lab Results  Component Value Date   HGBA1C 9.4 (H) 04/06/2022      Assessment & Plan:   Problem List Items Addressed This Visit       Cardiovascular and Mediastinum   Primary hypertension - Primary    Blood pressure 128/82 in office today. Encourage patient to consume low-salt heart, healthy diet and follow regular exercise schedule. Continue Losartan-HCTZ. Will check CBC, CMP       Relevant Orders   CBC with Differential/Platelet   Comprehensive metabolic panel   TSH     Endocrine   Type 2 diabetes mellitus without complication, without long-term current use of insulin    Last hemoglobin A1c 9.4 on 04/06/22 Advised patient to eat and follow regular exercise schedule. Continue glipizide and Ozempic  Will check hemoglobin A1c      Relevant Medications   Semaglutide,0.25 or 0.5MG /DOS, (OZEMPIC, 0.25 OR 0.5 MG/DOSE,) 2 MG/3ML SOPN   Other Relevant Orders   CBC with Differential/Platelet   Lipid panel   HgB A1c     Other   Morbid obesity  Body mass index is 48.58 kg/m. Advised pt to lose weight. Advised patient to avoid trans fat, fatty and fried food. Follow a regular physical activity schedule. Went over the risk of chronic diseases with increased weight.          Relevant Medications   Semaglutide,0.25 or 0.5MG /DOS, (OZEMPIC, 0.25 OR 0.5 MG/DOSE,) 2 MG/3ML SOPN   Hyperlipidemia    Will check fasting lipid.        Meds ordered this encounter  Medications   Semaglutide,0.25 or 0.5MG /DOS, (OZEMPIC, 0.25 OR 0.5 MG/DOSE,) 2 MG/3ML SOPN    Sig: Inject 0.5 mg into the skin once a week.    Dispense:  9 mL    Refill:  2     Follow-up: Return in about 6 months (around  01/07/2023) for chronic management.    Kara Dies, NP

## 2022-07-08 NOTE — Patient Instructions (Addendum)
Labs ordered. Cut down on food containing sulphur like High protein foods, such as chicken, fish, and beef Also cut down on Eggs, cheese, whole milk veges broccoli, cauliflower, brussels sprouts, cabbage and beans. Exercise and heathy diet

## 2022-07-09 ENCOUNTER — Encounter: Payer: Self-pay | Admitting: Nurse Practitioner

## 2022-07-09 ENCOUNTER — Other Ambulatory Visit (INDEPENDENT_AMBULATORY_CARE_PROVIDER_SITE_OTHER): Payer: No Typology Code available for payment source

## 2022-07-09 DIAGNOSIS — E785 Hyperlipidemia, unspecified: Secondary | ICD-10-CM | POA: Insufficient documentation

## 2022-07-09 DIAGNOSIS — E119 Type 2 diabetes mellitus without complications: Secondary | ICD-10-CM | POA: Diagnosis not present

## 2022-07-09 DIAGNOSIS — I10 Essential (primary) hypertension: Secondary | ICD-10-CM

## 2022-07-09 LAB — CBC WITH DIFFERENTIAL/PLATELET
Basophils Absolute: 0 10*3/uL (ref 0.0–0.1)
Basophils Relative: 0.9 % (ref 0.0–3.0)
Eosinophils Absolute: 0.3 10*3/uL (ref 0.0–0.7)
Eosinophils Relative: 6.7 % — ABNORMAL HIGH (ref 0.0–5.0)
HCT: 35.8 % — ABNORMAL LOW (ref 36.0–46.0)
Hemoglobin: 11.9 g/dL — ABNORMAL LOW (ref 12.0–15.0)
Lymphocytes Relative: 45.4 % (ref 12.0–46.0)
Lymphs Abs: 2.3 10*3/uL (ref 0.7–4.0)
MCHC: 33.1 g/dL (ref 30.0–36.0)
MCV: 93.9 fl (ref 78.0–100.0)
Monocytes Absolute: 0.2 10*3/uL (ref 0.1–1.0)
Monocytes Relative: 4.6 % (ref 3.0–12.0)
Neutro Abs: 2.2 10*3/uL (ref 1.4–7.7)
Neutrophils Relative %: 42.4 % — ABNORMAL LOW (ref 43.0–77.0)
Platelets: 268 10*3/uL (ref 150.0–400.0)
RBC: 3.81 Mil/uL — ABNORMAL LOW (ref 3.87–5.11)
RDW: 13.8 % (ref 11.5–15.5)
WBC: 5.1 10*3/uL (ref 4.0–10.5)

## 2022-07-09 LAB — LIPID PANEL
Cholesterol: 214 mg/dL — ABNORMAL HIGH (ref 0–200)
HDL: 46.7 mg/dL (ref >39.00)
LDL Cholesterol: 142 mg/dL — ABNORMAL HIGH (ref 0–99)
NonHDL: 167.59
Total CHOL/HDL Ratio: 5
Triglycerides: 129 mg/dL (ref 0.0–149.0)
VLDL: 25.8 mg/dL (ref 0.0–40.0)

## 2022-07-09 LAB — COMPREHENSIVE METABOLIC PANEL
ALT: 10 U/L (ref 0–35)
AST: 12 U/L (ref 0–37)
Albumin: 3.6 g/dL (ref 3.5–5.2)
Alkaline Phosphatase: 130 U/L — ABNORMAL HIGH (ref 39–117)
BUN: 15 mg/dL (ref 6–23)
CO2: 24 mEq/L (ref 19–32)
Calcium: 8.5 mg/dL (ref 8.4–10.5)
Chloride: 109 mEq/L (ref 96–112)
Creatinine, Ser: 0.9 mg/dL (ref 0.40–1.20)
GFR: 73.81 mL/min (ref >60.00)
Glucose, Bld: 143 mg/dL — ABNORMAL HIGH (ref 70–99)
Potassium: 4.4 mEq/L (ref 3.5–5.1)
Sodium: 141 mEq/L (ref 135–145)
Total Bilirubin: 0.6 mg/dL (ref 0.2–1.2)
Total Protein: 6.6 g/dL (ref 6.0–8.3)

## 2022-07-09 LAB — TSH: TSH: 1.81 u[IU]/mL (ref 0.35–5.50)

## 2022-07-09 LAB — HEMOGLOBIN A1C: Hgb A1c MFr Bld: 7.5 % — ABNORMAL HIGH (ref 4.6–6.5)

## 2022-07-09 NOTE — Assessment & Plan Note (Signed)
Body mass index is 48.58 kg/m. Advised pt to lose weight. Advised patient to avoid trans fat, fatty and fried food. Follow a regular physical activity schedule. Went over the risk of chronic diseases with increased weight.

## 2022-07-09 NOTE — Assessment & Plan Note (Signed)
Will check fasting lipid.

## 2022-07-09 NOTE — Assessment & Plan Note (Addendum)
Blood pressure 128/82 in office today. Encourage patient to consume low-salt heart, healthy diet and follow regular exercise schedule. Continue Losartan-HCTZ. Will check CBC, CMP

## 2022-07-09 NOTE — Assessment & Plan Note (Signed)
Last hemoglobin A1c 9.4 on 04/06/22 Advised patient to eat and follow regular exercise schedule. Continue glipizide and Ozempic  Will check hemoglobin A1c

## 2022-07-09 NOTE — Addendum Note (Signed)
Addended by: Warden Fillers on: 07/09/2022 08:41 AM   Modules accepted: Orders

## 2022-07-14 NOTE — Progress Notes (Signed)
Keep up the good work hemoglobin A1c came down to 7.5 from 9.4. Kidney functions and thyroid within normal limits. Blood work shows slight anemia, try to take iron rich food. Total cholesterol decreased from 256 -214 but LDL is 142 should be less than 99. Would recommend starting cholesterol medication (Crestor 10 mg)to reduce risk of heart disease and stroke.

## 2022-07-15 ENCOUNTER — Other Ambulatory Visit: Payer: Self-pay | Admitting: Nurse Practitioner

## 2022-07-15 DIAGNOSIS — E785 Hyperlipidemia, unspecified: Secondary | ICD-10-CM

## 2022-07-15 MED ORDER — ROSUVASTATIN CALCIUM 10 MG PO TABS
10.0000 mg | ORAL_TABLET | Freq: Every day | ORAL | 3 refills | Status: DC
Start: 2022-07-15 — End: 2023-08-22

## 2022-07-15 NOTE — Progress Notes (Signed)
Crestor 10 mg has been sent to the pharmacy

## 2022-07-31 ENCOUNTER — Other Ambulatory Visit: Payer: Self-pay | Admitting: Nurse Practitioner

## 2022-08-10 ENCOUNTER — Encounter: Payer: Self-pay | Admitting: Podiatry

## 2022-08-10 ENCOUNTER — Ambulatory Visit: Payer: No Typology Code available for payment source | Admitting: Podiatry

## 2022-08-10 DIAGNOSIS — M76821 Posterior tibial tendinitis, right leg: Secondary | ICD-10-CM | POA: Diagnosis not present

## 2022-08-10 NOTE — Progress Notes (Signed)
Subjective:  Patient ID: Jessica Richardson, female    DOB: Jan 20, 1971,  MRN: [id number]  Chief Complaint  Patient presents with   Callouses    52 y.o. female presents with the above complaint.  Patient presents with right medial foot pain.  Patient states it is painful to touch is progressive gotten worse worse with ambulation worse with pressure she has not seen MRIs prior to seeing me for this.  She states that pain scale 7 out of 10 dull achy in nature she is very flat-footed.   Review of Systems: Negative except as noted in the HPI. Denies N/V/F/Ch.  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 Use up to four times daily as directed. (FOR ICD-10 E10.9, E11.9)., Disp: 1 each, Rfl: 0   glipiZIDE (GLUCOTROL) 5 MG tablet, TAKE 1 TABLET (5 MG TOTAL) BY MOUTH TWICE A DAY BEFORE MEALS, Disp: 180 tablet, Rfl: 1   ibuprofen (ADVIL,MOTRIN) 600 MG tablet, Take 1 tablet (600 mg total) by mouth every 6 (six) hours as needed. (Patient not taking: Reported on 07/08/2022), Disp: 30 tablet, Rfl: 0   losartan-hydrochlorothiazide (HYZAAR) 50-12.5 MG tablet, TAKE 1 TABLET BY MOUTH EVERY DAY, Disp: 90 tablet, Rfl: 1   omeprazole (PRILOSEC) 40 MG capsule, TAKE 1 CAPSULE (40 MG TOTAL) BY MOUTH DAILY., Disp: 90 capsule, Rfl: 1   rosuvastatin (CRESTOR) 10 MG tablet, Take 1 tablet (10 mg total) by mouth daily., Disp: 90 tablet, Rfl: 3   Semaglutide,0.25 or 0.5MG /DOS, (OZEMPIC, 0.25 OR 0.5 MG/DOSE,) 2 MG/3ML SOPN, Inject 0.5 mg into the skin once a week., Disp: 9 mL, Rfl: 2  Social History   Tobacco Use  Smoking  Status Every Day   Types: Cigars   Last attempt to quit: 03/24/2011   Years since quitting: 11.4  Smokeless Tobacco Never  Tobacco Comments   Trying to quit. There are days she does not smoke at all. She smokes two cigarettes a week.     Allergies  Allergen Reactions   Metformin And Related     GI Upset   Penicillins Hives   Objective:  There were no vitals filed for this visit. There is no height or weight on file to calculate BMI. Constitutional Well developed. Well nourished.  Vascular Dorsalis pedis pulses palpable bilaterally. Posterior tibial pulses palpable bilaterally. Capillary refill normal to all digits.  No cyanosis or clubbing noted. Pedal hair growth normal.  Neurologic Normal speech. Oriented to person, place, and time. Epicritic sensation to light touch grossly present bilaterally.  Dermatologic Nails well groomed and normal in appearance. No open wounds. No skin lesions.  Orthopedic: Pain along the course of posterior tibial tendon pain with resisted inversion plantarflexion of the foot no pain with dorsiflexion eversion of the foot.  No pain at the Achilles tendon peroneal tendon ATFL ligament   Radiographs: None Assessment:   1. Posterior tibial tendinitis, right    Plan:  Patient was evaluated and treated and all questions answered.  Right posterior tibial tendinitis -All questions and concerns were discussed with  the patient in extensive detail given the amount of pain that she is experiencing she will benefit from cam boot immobilization.  Patient agrees with plan like to proceed with cam boot immobilization -Cam boot was dispensed.  No follow-ups on file.

## 2022-09-09 ENCOUNTER — Ambulatory Visit: Payer: No Typology Code available for payment source | Admitting: Podiatry

## 2022-09-24 ENCOUNTER — Encounter (INDEPENDENT_AMBULATORY_CARE_PROVIDER_SITE_OTHER): Payer: Self-pay

## 2022-09-28 ENCOUNTER — Ambulatory Visit: Payer: 59 | Admitting: Podiatry

## 2022-09-28 DIAGNOSIS — M76821 Posterior tibial tendinitis, right leg: Secondary | ICD-10-CM

## 2022-10-07 NOTE — Progress Notes (Signed)
Subjective:  Patient ID: Jessica Richardson, female    DOB: [birth year],  MRN: [id number]  Chief Complaint  Patient presents with   Foot Pain    52 y.o. female presents with the above complaint.  Presents for follow-up of right medial foot/posterior tibial tendinitis.  She states she is doing better she still has some residual pain but denies any other acute complaints.  Cam boot helped   Review of Systems: Negative except as noted in the HPI. Denies N/V/F/Ch.  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 Use up to four times daily as directed. (FOR ICD-10 E10.9, E11.9)., Disp: 1 each, Rfl: 0   glipiZIDE (GLUCOTROL) 5 MG tablet, TAKE 1 TABLET (5 MG TOTAL) BY MOUTH TWICE A DAY BEFORE MEALS, Disp: 180 tablet, Rfl: 1   ibuprofen (ADVIL,MOTRIN) 600 MG tablet, Take 1 tablet (600 mg total) by mouth every 6 (six) hours as needed. (Patient not taking: Reported on 07/08/2022), Disp: 30 tablet, Rfl: 0   losartan-hydrochlorothiazide (HYZAAR) 50-12.5 MG tablet, TAKE 1 TABLET BY MOUTH EVERY DAY, Disp: 90 tablet, Rfl: 1   meloxicam (MOBIC) 7.5 MG tablet, Take 7.5 mg by mouth daily., Disp: , Rfl:    naproxen (NAPROSYN) 500 MG tablet, Take 500 mg by mouth 2 (two) times daily as needed., Disp: , Rfl:    omeprazole (PRILOSEC) 40 MG capsule, TAKE 1 CAPSULE (40 MG TOTAL) BY MOUTH DAILY., Disp: 90 capsule, Rfl: 1   rosuvastatin (CRESTOR) 10 MG tablet, Take 1 tablet (10 mg total) by mouth daily., Disp: 90 tablet, Rfl: 3   Semaglutide,0.25 or 0.5MG /DOS, (OZEMPIC, 0.25 OR 0.5 MG/DOSE,) 2 MG/3ML SOPN, Inject 0.5 mg into the skin once a week., Disp: 9 mL, Rfl: 2  Social History   Tobacco Use  Smoking Status Every Day   Types: Cigars   Last attempt to quit: 03/24/2011   Years since quitting: 11.5   Smokeless Tobacco Never  Tobacco Comments   Trying to quit. There are days she does not smoke at all. She smokes two cigarettes a week.     Allergies  Allergen Reactions   Metformin And Related     GI Upset   Penicillins Hives   Objective:  There were no vitals filed for this visit. There is no height or weight on file to calculate BMI. Constitutional Well developed. Well nourished.  Vascular Dorsalis pedis pulses palpable bilaterally. Posterior tibial pulses palpable bilaterally. Capillary refill normal to all digits.  No cyanosis or clubbing noted. Pedal hair growth normal.  Neurologic Normal speech. Oriented to person, place, and time. Epicritic sensation to light touch grossly present bilaterally.  Dermatologic Nails well groomed and normal in appearance. No open wounds. No skin lesions.  Orthopedic: Pain along the course of posterior tibial tendon pain with resisted inversion plantarflexion of the foot no pain with dorsiflexion eversion of the foot.  No pain at the Achilles tendon peroneal tendon ATFL ligament   Radiographs: None Assessment:   1. Posterior tibial tendinitis, right     Plan:  Patient was evaluated and treated and all questions answered.  Right posterior tibial tendinitis -All questions and concerns were discussed with the patient in extensive detail clinically patient is doing a little bit better.  She still has some residual pain  for which she would like to do steroid injection denies any other acute close I discussed the rupture associate with this she states understand like to proceed with injection -A steroid injection was performed at right medial foot using 1% plain Lidocaine and 10 mg of Kenalog. This was well tolerated.   No follow-ups on file.

## 2022-10-26 ENCOUNTER — Ambulatory Visit: Payer: 59 | Admitting: Podiatry

## 2022-10-26 DIAGNOSIS — M216X1 Other acquired deformities of right foot: Secondary | ICD-10-CM

## 2022-10-26 DIAGNOSIS — M216X2 Other acquired deformities of left foot: Secondary | ICD-10-CM | POA: Diagnosis not present

## 2022-10-26 DIAGNOSIS — M76821 Posterior tibial tendinitis, right leg: Secondary | ICD-10-CM | POA: Diagnosis not present

## 2022-10-26 DIAGNOSIS — M21961 Unspecified acquired deformity of right lower leg: Secondary | ICD-10-CM

## 2022-10-26 DIAGNOSIS — M21962 Unspecified acquired deformity of left lower leg: Secondary | ICD-10-CM

## 2022-10-26 NOTE — Progress Notes (Signed)
Subjective:  Patient ID: Jessica Richardson, female    DOB: 07-13-1970,  MRN: [id number]  Chief Complaint  Patient presents with   Foot Pain    52 y.o. female presents with the above complaint.  Presents for follow-up of right medial foot/posterior tibial tendinitis.  She states she is doing better she still has some residual pain but denies any other acute complaints.  Cam boot has not helped injection has not helped would like to discuss next treatment plan   Review of Systems: Negative except as noted in the HPI. Denies N/V/F/Ch.  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 Use up to four times daily as directed. (FOR ICD-10 E10.9, E11.9)., Disp: 1 each, Rfl: 0   glipiZIDE (GLUCOTROL) 5 MG tablet, TAKE 1 TABLET (5 MG TOTAL) BY MOUTH TWICE A DAY BEFORE MEALS, Disp: 180 tablet, Rfl: 1   ibuprofen (ADVIL,MOTRIN) 600 MG tablet, Take 1 tablet (600 mg total) by mouth every 6 (six) hours as needed. (Patient not taking: Reported on 07/08/2022), Disp: 30 tablet, Rfl: 0   losartan-hydrochlorothiazide (HYZAAR) 50-12.5 MG tablet, TAKE 1 TABLET BY MOUTH EVERY DAY, Disp: 90 tablet, Rfl: 1   meloxicam (MOBIC) 7.5 MG tablet, Take 7.5 mg by mouth daily., Disp: , Rfl:    naproxen (NAPROSYN) 500 MG tablet, Take 500 mg by mouth 2 (two) times daily as needed., Disp: , Rfl:    omeprazole (PRILOSEC) 40 MG capsule, TAKE 1 CAPSULE (40 MG TOTAL) BY MOUTH DAILY., Disp: 90 capsule, Rfl: 1   rosuvastatin (CRESTOR) 10 MG tablet, Take 1 tablet (10 mg total) by mouth daily., Disp: 90 tablet, Rfl: 3   Semaglutide,0.25 or 0.5MG /DOS, (OZEMPIC, 0.25 OR 0.5 MG/DOSE,) 2 MG/3ML SOPN, Inject 0.5 mg into the skin once a week., Disp: 9 mL, Rfl: 2  Social History   Tobacco Use  Smoking Status Every Day   Types:  Cigars   Last attempt to quit: 03/24/2011   Years since quitting: 11.6  Smokeless Tobacco Never  Tobacco Comments   Trying to quit. There are days she does not smoke at all. She smokes two cigarettes a week.     Allergies  Allergen Reactions   Metformin And Related     GI Upset   Penicillins Hives   Objective:  There were no vitals filed for this visit. There is no height or weight on file to calculate BMI. Constitutional Well developed. Well nourished.  Vascular Dorsalis pedis pulses palpable bilaterally. Posterior tibial pulses palpable bilaterally. Capillary refill normal to all digits.  No cyanosis or clubbing noted. Pedal hair growth normal.  Neurologic Normal speech. Oriented to person, place, and time. Epicritic sensation to light touch grossly present bilaterally.  Dermatologic Nails well groomed and normal in appearance. No open wounds. No skin lesions.  Orthopedic: Pain along the course of posterior tibial tendon pain with resisted inversion plantarflexion of the foot no pain with dorsiflexion eversion of the foot.  No pain at the Achilles tendon peroneal tendon ATFL ligament   Radiographs: None Assessment:   1. Posterior tibial tendinitis, right   2. Foot deformity, right   3. Foot deformity, left     Plan:  Patient was evaluated and treated and all questions answered.  Right posterior tibial tendinitis -All questions  and concerns were discussed with the patient in extensive detail  -Given that patient still continues to hurt I believe patient will benefit from an MRI to the right posterior tibial tendon to rule out tearing -MRI was ordered -Orthotics were dispensed and they are functioning well.   No follow-ups on file.

## 2022-10-27 ENCOUNTER — Other Ambulatory Visit: Payer: Self-pay | Admitting: Nurse Practitioner

## 2022-11-04 ENCOUNTER — Encounter: Payer: Self-pay | Admitting: Podiatry

## 2022-11-05 ENCOUNTER — Encounter: Payer: Self-pay | Admitting: Podiatry

## 2022-11-08 ENCOUNTER — Other Ambulatory Visit: Payer: No Typology Code available for payment source

## 2022-11-23 ENCOUNTER — Ambulatory Visit: Payer: 59 | Admitting: Podiatry

## 2022-11-30 ENCOUNTER — Ambulatory Visit: Payer: 59 | Admitting: Podiatry

## 2022-11-30 DIAGNOSIS — M76821 Posterior tibial tendinitis, right leg: Secondary | ICD-10-CM | POA: Diagnosis not present

## 2022-11-30 MED ORDER — METHYLPREDNISOLONE 4 MG PO TBPK: ORAL_TABLET | ORAL | 0 refills | Status: AC

## 2022-11-30 NOTE — Progress Notes (Signed)
Subjective:  Patient ID: Jessica Richardson, female    DOB: [birth year],  MRN: [id number]  Chief Complaint  Patient presents with   Foot Pain    Pt stated that she still has some discomfort     52 y.o. female presents with the above complaint.  Presents for follow-up of right medial foot/posterior tibial tendinitis.  She is she is awaiting MRI.  She is waiting on insurance authorization denies any other acute complaints she is doing some home physical therapy   Review of Systems: Negative except as noted in the HPI. Denies N/V/F/Ch.  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 Use up to four times daily as directed. (FOR ICD-10 E10.9, E11.9)., Disp: 1 each, Rfl: 0   glipiZIDE (GLUCOTROL) 5 MG tablet, TAKE 1 TABLET (5 MG TOTAL) BY MOUTH TWICE A DAY BEFORE MEALS, Disp: 180 tablet, Rfl: 1   ibuprofen (ADVIL,MOTRIN) 600 MG tablet, Take 1 tablet (600 mg total) by mouth every 6 (six) hours as needed. (Patient not taking: Reported on 07/08/2022), Disp: 30 tablet, Rfl: 0   losartan-hydrochlorothiazide (HYZAAR) 50-12.5 MG tablet, TAKE 1 TABLET BY MOUTH EVERY DAY, Disp: 90 tablet, Rfl: 1   meloxicam (MOBIC) 7.5 MG tablet, Take 7.5 mg by mouth daily., Disp: , Rfl:    methylPREDNISolone (MEDROL DOSEPAK) 4 MG TBPK tablet, Take as directed, Disp: 21 each, Rfl: 0   naproxen (NAPROSYN) 500 MG tablet, Take 500 mg by mouth 2 (two) times daily as needed., Disp: , Rfl:    omeprazole (PRILOSEC) 40 MG capsule, TAKE 1 CAPSULE (40 MG TOTAL) BY MOUTH DAILY., Disp: 90 capsule, Rfl: 1   rosuvastatin (CRESTOR) 10 MG tablet, Take 1 tablet (10 mg total) by mouth daily., Disp: 90 tablet, Rfl: 3   Semaglutide,0.25 or 0.5MG /DOS, (OZEMPIC, 0.25 OR 0.5 MG/DOSE,) 2 MG/3ML SOPN, Inject 0.5 mg into the skin once a  week., Disp: 9 mL, Rfl: 2  Social History   Tobacco Use  Smoking Status Every Day   Types: Cigars   Last attempt to quit: 03/24/2011   Years since quitting: 11.6  Smokeless Tobacco Never  Tobacco Comments   Trying to quit. There are days she does not smoke at all. She smokes two cigarettes a week.     Allergies  Allergen Reactions   Metformin And Related     GI Upset   Penicillins Hives   Objective:  There were no vitals filed for this visit. There is no height or weight on file to calculate BMI. Constitutional Well developed. Well nourished.  Vascular Dorsalis pedis pulses palpable bilaterally. Posterior tibial pulses palpable bilaterally. Capillary refill normal to all digits.  No cyanosis or clubbing noted. Pedal hair growth normal.  Neurologic Normal speech. Oriented to person, place, and time. Epicritic sensation to light touch grossly present bilaterally.  Dermatologic Nails well groomed and normal in appearance. No open wounds. No skin lesions.  Orthopedic: Pain along the course of posterior tibial tendon pain with resisted inversion plantarflexion of the foot no pain with dorsiflexion eversion of the foot.  No pain at the Achilles tendon peroneal tendon ATFL ligament   Radiographs: None Assessment:   No diagnosis found.   Plan:  Patient was evaluated and treated and all questions answered.  Right posterior tibial  tendinitis -All questions and concerns were discussed with the patient in extensive detail  -Given that patient still continues to hurt I believe patient will benefit from an MRI to the right posterior tibial tendon to rule out tearing -Awaiting MRI -Orthotics were dispensed and they are functioning well. -Another Tri-Lock ankle brace was dispensed as the previous 1 is damaged   No follow-ups on file.

## 2023-01-07 ENCOUNTER — Ambulatory Visit: Payer: No Typology Code available for payment source | Admitting: Nurse Practitioner

## 2023-01-13 ENCOUNTER — Ambulatory Visit: Payer: Self-pay | Admitting: Nurse Practitioner

## 2023-01-26 ENCOUNTER — Other Ambulatory Visit: Payer: Self-pay | Admitting: Nurse Practitioner

## 2023-02-25 ENCOUNTER — Encounter: Payer: Self-pay | Admitting: Nurse Practitioner

## 2023-02-25 ENCOUNTER — Telehealth: Payer: Self-pay | Admitting: Nurse Practitioner

## 2023-02-25 ENCOUNTER — Ambulatory Visit: Payer: 59 | Admitting: Nurse Practitioner

## 2023-02-25 VITALS — BP 142/82 | HR 82 | Temp 98.4°F | Ht 64.0 in | Wt 296.0 lb

## 2023-02-25 DIAGNOSIS — R350 Frequency of micturition: Secondary | ICD-10-CM

## 2023-02-25 DIAGNOSIS — E119 Type 2 diabetes mellitus without complications: Secondary | ICD-10-CM

## 2023-02-25 DIAGNOSIS — Z7985 Long-term (current) use of injectable non-insulin antidiabetic drugs: Secondary | ICD-10-CM

## 2023-02-25 DIAGNOSIS — E785 Hyperlipidemia, unspecified: Secondary | ICD-10-CM | POA: Diagnosis not present

## 2023-02-25 DIAGNOSIS — Z7984 Long term (current) use of oral hypoglycemic drugs: Secondary | ICD-10-CM | POA: Diagnosis not present

## 2023-02-25 DIAGNOSIS — I10 Essential (primary) hypertension: Secondary | ICD-10-CM | POA: Diagnosis not present

## 2023-02-25 DIAGNOSIS — N3000 Acute cystitis without hematuria: Secondary | ICD-10-CM | POA: Diagnosis not present

## 2023-02-25 LAB — URINALYSIS, ROUTINE W REFLEX MICROSCOPIC
Bilirubin Urine: NEGATIVE
Hgb urine dipstick: NEGATIVE
Ketones, ur: NEGATIVE
Nitrite: POSITIVE — AB
Specific Gravity, Urine: 1.025 (ref 1.000–1.030)
Total Protein, Urine: NEGATIVE
Urine Glucose: NEGATIVE
Urobilinogen, UA: 0.2 (ref 0.0–1.0)
pH: 6 (ref 5.0–8.0)

## 2023-02-25 LAB — CBC WITH DIFFERENTIAL/PLATELET
Basophils Absolute: 0 10*3/uL (ref 0.0–0.1)
Basophils Relative: 0.8 % (ref 0.0–3.0)
Eosinophils Absolute: 0.2 10*3/uL (ref 0.0–0.7)
Eosinophils Relative: 4.4 % (ref 0.0–5.0)
HCT: 36.5 % (ref 36.0–46.0)
Hemoglobin: 12.8 g/dL (ref 12.0–15.0)
Lymphocytes Relative: 42.8 % (ref 12.0–46.0)
Lymphs Abs: 2.4 10*3/uL (ref 0.7–4.0)
MCHC: 35.1 g/dL (ref 30.0–36.0)
MCV: 95.1 fL (ref 78.0–100.0)
Monocytes Absolute: 0.3 10*3/uL (ref 0.1–1.0)
Monocytes Relative: 4.6 % (ref 3.0–12.0)
Neutro Abs: 2.6 10*3/uL (ref 1.4–7.7)
Neutrophils Relative %: 47.4 % (ref 43.0–77.0)
Platelets: 275 10*3/uL (ref 150.0–400.0)
RBC: 3.83 Mil/uL — ABNORMAL LOW (ref 3.87–5.11)
RDW: 14.2 % (ref 11.5–15.5)
WBC: 5.5 10*3/uL (ref 4.0–10.5)

## 2023-02-25 LAB — COMPREHENSIVE METABOLIC PANEL
ALT: 13 U/L (ref 0–35)
AST: 14 U/L (ref 0–37)
Albumin: 3.7 g/dL (ref 3.5–5.2)
Alkaline Phosphatase: 135 U/L — ABNORMAL HIGH (ref 39–117)
BUN: 19 mg/dL (ref 6–23)
CO2: 29 meq/L (ref 19–32)
Calcium: 8.7 mg/dL (ref 8.4–10.5)
Chloride: 107 meq/L (ref 96–112)
Creatinine, Ser: 1.01 mg/dL (ref 0.40–1.20)
GFR: 63.99 mL/min (ref >60.00)
Glucose, Bld: 181 mg/dL — ABNORMAL HIGH (ref 70–99)
Potassium: 4 meq/L (ref 3.5–5.1)
Sodium: 142 meq/L (ref 135–145)
Total Bilirubin: 0.7 mg/dL (ref 0.2–1.2)
Total Protein: 7.1 g/dL (ref 6.0–8.3)

## 2023-02-25 LAB — HEMOGLOBIN A1C: Hgb A1c MFr Bld: 9.8 % — ABNORMAL HIGH (ref 4.6–6.5)

## 2023-02-25 LAB — MICROALBUMIN / CREATININE URINE RATIO
Creatinine,U: 120.6 mg/dL
Microalb Creat Ratio: 3.2 mg/g (ref 0.0–30.0)
Microalb, Ur: 3.8 mg/dL — ABNORMAL HIGH (ref 0.0–1.9)

## 2023-02-25 LAB — LIPID PANEL
Cholesterol: 189 mg/dL (ref 0–200)
HDL: 59.6 mg/dL (ref >39.00)
LDL Cholesterol: 106 mg/dL — ABNORMAL HIGH (ref 0–99)
NonHDL: 129.08
Total CHOL/HDL Ratio: 3
Triglycerides: 116 mg/dL (ref 0.0–149.0)
VLDL: 23.2 mg/dL (ref 0.0–40.0)

## 2023-02-25 MED ORDER — TIRZEPATIDE 5 MG/0.5ML ~~LOC~~ SOAJ: 5.0000 mg | SUBCUTANEOUS | 2 refills | Status: DC

## 2023-02-25 NOTE — Patient Instructions (Addendum)
Please go to the lab for blood work. Please check BP at home twice a day at same time and send [place] the numbers via Mychart in 2 weeks. Stop ozempic and start mounjaro once a week.

## 2023-02-25 NOTE — Telephone Encounter (Signed)
Patient states a PA is needed for medication please call [phone]

## 2023-02-25 NOTE — Progress Notes (Signed)
Established Patient Office Visit  Subjective:  Patient ID: Jessica Richardson, female    DOB: 12/21/1970  Age: 52 y.o. MRN: [id number]  CC:  Chief Complaint  Patient presents with   Medical Management of Chronic Issues    Discuss weight loss      HPI  CLAUDELL LINQUIST presents for chronic disease management.  She has history of diabetes, hypertension, hyperlipidemia and arthritis.  Patient patient recently got injured at work and has tendon repair surgery of left shoulder. Patient states that she has been doing diet management and exercise for things like her weight is not coming down with Ozempic.  She would like to switch to [facility].  She has been experiencing urinary frequency for few days, she denies any abdominal pain or dysuria.  She would like to get tested for UTI.  Denies any abdominal pain, diarrhea, constipation, nausea vomiting.  HPI   Past Medical History:  Diagnosis Date   Arthritis    Depression    Diabetes mellitus    Hip arthritis, left, post-SCFE 03/29/2011    Past Surgical History:  Procedure Laterality Date   ABDOMINAL HYSTERECTOMY     APPENDECTOMY     CESAREAN SECTION     COLONOSCOPY WITH PROPOFOL N/A 02/05/2022   Procedure: COLONOSCOPY WITH PROPOFOL;  Surgeon: Wyline Mood, MD;  Location: [facility] ENDOSCOPY;  Service: Gastroenterology;  Laterality: N/A;   FRACTURE SURGERY     JOINT REPLACEMENT     pins both hips   TIBIA IM NAIL INSERTION Right 05/15/2012   Procedure: Open Reduction Internal Fixation Right Tibia;  Surgeon: Eulas Post, MD;  Location: [facility] OR;  Service: Orthopedics;  Laterality: Right;   TONSILLECTOMY     TOTAL HIP ARTHROPLASTY  03/29/2011   Procedure: TOTAL HIP ARTHROPLASTY;  Surgeon: Eulas Post;  Location: [facility] OR;  Service: Orthopedics;  Laterality: Left;  with removal of retained hardware (knowles pins)    Family History  Problem Relation Age of Onset   Breast cancer Maternal Aunt     Social History   Socioeconomic  History   Marital status: Single    Spouse name: Not on file   Number of children: Not on file   Years of education: Not on file   Highest education level: Not on file  Occupational History   Not on file  Tobacco Use   Smoking status: Every Day    Types: Cigars    Last attempt to quit: 03/24/2011    Years since quitting: 11.9   Smokeless tobacco: Never   Tobacco comments:    Trying to quit. There are days she does not smoke at all. She smokes two cigarettes a week.   Vaping Use   Vaping status: Never Used  Substance and Sexual Activity   Alcohol use: No    Comment: rarely   Drug use: No   Sexual activity: Never  Other Topics Concern   Not on file  Social History Narrative   Not on file   Social Drivers of Health   Financial Resource Strain: Not on file  Food Insecurity: Not on file  Transportation Needs: Not on file  Physical Activity: Not on file  Stress: Not on file  Social Connections: Not on file  Intimate Partner Violence: Not on file     Outpatient Medications Prior to Visit  Medication Sig Dispense Refill   blood glucose meter kit and supplies Dispense based on patient and insurance preference. Use up to four times daily as  directed. (FOR ICD-10 E10.9, E11.9). 1 each 0   glipiZIDE (GLUCOTROL) 5 MG tablet TAKE 1 TABLET (5 MG TOTAL) BY MOUTH TWICE A DAY BEFORE MEALS 180 tablet 1   ibuprofen (ADVIL,MOTRIN) 600 MG tablet Take 1 tablet (600 mg total) by mouth every 6 (six) hours as needed. 30 tablet 0   losartan-hydrochlorothiazide (HYZAAR) 50-12.5 MG tablet TAKE 1 TABLET BY MOUTH EVERY DAY 90 tablet 1   meloxicam (MOBIC) 7.5 MG tablet Take 7.5 mg by mouth daily.     naproxen (NAPROSYN) 500 MG tablet Take 500 mg by mouth 2 (two) times daily as needed.     omeprazole (PRILOSEC) 40 MG capsule TAKE 1 CAPSULE (40 MG TOTAL) BY MOUTH DAILY. 90 capsule 1   rosuvastatin (CRESTOR) 10 MG tablet Take 1 tablet (10 mg total) by mouth daily. 90 tablet 3   Semaglutide,0.25 or  0.5MG /DOS, (OZEMPIC, 0.25 OR 0.5 MG/DOSE,) 2 MG/3ML SOPN Inject 0.5 mg into the skin once a week. 9 mL 2   methylPREDNISolone (MEDROL DOSEPAK) 4 MG TBPK tablet Take as directed 21 each 0   No facility-administered medications prior to visit.    Allergies  Allergen Reactions   Metformin And Related     GI Upset   Penicillins Hives    ROS Review of Systems  Constitutional:  Positive for fatigue.  HENT: Negative.    Eyes: Negative.   Respiratory:  Negative for chest tightness and shortness of breath.   Cardiovascular:  Positive for leg swelling.  Gastrointestinal: Negative.        But smells like sulfur  Genitourinary: Negative.   Musculoskeletal: Negative.        Injury to right hand  Neurological: Negative.   Psychiatric/Behavioral: Negative.        Objective:    Physical Exam Constitutional:      Appearance: Normal appearance. She is obese.  HENT:     Head: Normocephalic.     Right Ear: Tympanic membrane normal.     Left Ear: Tympanic membrane normal.     Nose: Nose normal.     Mouth/Throat:     Mouth: Mucous membranes are moist.     Pharynx: Oropharynx is clear.  Eyes:     Extraocular Movements: Extraocular movements intact.     Conjunctiva/sclera: Conjunctivae normal.     Pupils: Pupils are equal, round, and reactive to light.  Cardiovascular:     Rate and Rhythm: Normal rate and regular rhythm.     Pulses: Normal pulses.     Heart sounds: Normal heart sounds.  Pulmonary:     Effort: Pulmonary effort is normal. No respiratory distress.     Breath sounds: Normal breath sounds. No rhonchi.  Musculoskeletal:     Cervical back: Neck supple. No tenderness.  Skin:    General: Skin is warm.     Capillary Refill: Capillary refill takes less than 2 seconds.  Neurological:     General: No focal deficit present.     Mental Status: She is alert and oriented to person, place, and time. Mental status is at baseline.  Psychiatric:        Mood and Affect: Mood normal.         Behavior: Behavior normal.        Thought Content: Thought content normal.        Judgment: Judgment normal.     BP (!) 142/82   Pulse 82   Temp 98.4 F (36.9 C) (Oral)   Ht 5\' 4"  (1.626  m)   Wt 296 lb (134.3 kg)   LMP 07/17/2002 (Approximate)   SpO2 98%   BMI 50.81 kg/m  Wt Readings from Last 3 Encounters:  02/25/23 296 lb (134.3 kg)  07/08/22 283 lb (128.4 kg)  04/02/22 285 lb (129.3 kg)     Health Maintenance  Topic Date Due   FOOT EXAM  Never done   OPHTHALMOLOGY EXAM  Never done   HIV Screening  Never done   Zoster Vaccines- Shingrix (1 of 2) Never done   COVID-19 Vaccine (1 - 2024-25 season) Never done   INFLUENZA VACCINE  06/20/2023 (Originally 10/21/2022)   HEMOGLOBIN A1C  08/26/2023   MAMMOGRAM  12/18/2023   Diabetic kidney evaluation - eGFR measurement  02/25/2024   Diabetic kidney evaluation - Urine ACR  02/25/2024   Cervical Cancer Screening (HPV/Pap Cotest)  10/22/2024   Colonoscopy  02/05/2029   DTaP/Tdap/Td (2 - Td or Tdap) 10/23/2031   Hepatitis C Screening  Completed   HPV VACCINES  Aged Out    There are no preventive care reminders to display for this patient.  Lab Results  Component Value Date   TSH 1.81 07/09/2022   Lab Results  Component Value Date   WBC 5.5 02/25/2023   HGB 12.8 02/25/2023   HCT 36.5 02/25/2023   MCV 95.1 02/25/2023   PLT 275.0 02/25/2023   Lab Results  Component Value Date   NA 142 02/25/2023   K 4.0 02/25/2023   CO2 29 02/25/2023   GLUCOSE 181 (H) 02/25/2023   BUN 19 02/25/2023   CREATININE 1.01 02/25/2023   BILITOT 0.7 02/25/2023   ALKPHOS 135 (H) 02/25/2023   AST 14 02/25/2023   ALT 13 02/25/2023   PROT 7.1 02/25/2023   ALBUMIN 3.7 02/25/2023   CALCIUM 8.7 02/25/2023   EGFR 73 10/23/2021   GFR 63.99 02/25/2023   Lab Results  Component Value Date   CHOL 189 02/25/2023   Lab Results  Component Value Date   HDL 59.60 02/25/2023   Lab Results  Component Value Date   LDLCALC 106 (H)  02/25/2023   Lab Results  Component Value Date   TRIG 116.0 02/25/2023   Lab Results  Component Value Date   CHOLHDL 3 02/25/2023   Lab Results  Component Value Date   HGBA1C 9.8 (H) 02/25/2023      Assessment & Plan:   Problem List Items Addressed This Visit       Unprioritized   Morbid obesity (HCC)   Body mass index is 50.81 kg/m. Advised pt to lose weight. Advised patient to avoid trans fat, fatty and fried food. Follow a regular physical activity schedule. Went over the risk of chronic diseases with increased weight.          Relevant Medications   tirzepatide [facility]) 5 MG/0.5ML Pen   Type 2 diabetes mellitus without complication, without long-term current use of insulin (HCC) - Primary   Advised patient to healthy diet and follow regular exercise schedule. Will stop Ozempic and start on Mounjaro once a week. Continue glipizide daily Will check hemoglobin A1c      Relevant Medications   tirzepatide [facility]) 5 MG/0.5ML Pen   Other Relevant Orders   Hemoglobin A1c (Completed)   Urine Albumin/Creatinine with ratio (send out) [LAB689] (Completed)   Hypertension   Patient BP  Vitals:   02/25/23 1201 02/25/23 1203  BP: (!) 160/100 (!) 142/82    in the office. Advised pt to follow a low sodium and heart  healthy diet. Encourage patient to consume low-salt heart, healthy diet and follow regular exercise schedule. Continue Losartan-hydrochlorothiazide 50-12.5 mg. She will monitor blood pressure for 2-week and  will send the numbers via MyChart, if blood pressure remains elevated would adjust medication regimen. Will check CBC, CMP      Relevant Orders   CBC with Differential/Platelet (Completed)   Comprehensive metabolic panel (Completed)   Hyperlipidemia   Diet and exercise discussed. Will check lipid panel      Relevant Orders   Lipid panel (Completed)   Urinary frequency   Relevant Orders   Urine Culture (Completed)   Urinalysis, Routine w  reflex microscopic (Completed)   Acute cystitis without hematuria   Urine culture positive for E. coli. Will treat with Macrobid. Patient would let us know if symptoms not improving.         Meds ordered this encounter  Medications   tirzepatide (MOUNJARO) 5 MG/0.5ML Pen    Sig: Inject 5 mg into the skin once a week.    Dispense:  6 mL    Refill:  2     Follow-up: Return in about 3 months (around 05/26/2023) for chronic management.    Kara Dies, NP

## 2023-02-27 LAB — URINE CULTURE
MICRO NUMBER:: 15819116
SPECIMEN QUALITY:: ADEQUATE

## 2023-02-28 ENCOUNTER — Other Ambulatory Visit: Payer: Self-pay | Admitting: Nurse Practitioner

## 2023-02-28 MED ORDER — NITROFURANTOIN MONOHYD MACRO 100 MG PO CAPS: 100.0000 mg | ORAL_CAPSULE | Freq: Two times a day (BID) | ORAL | 0 refills | Status: AC

## 2023-02-28 NOTE — Progress Notes (Signed)
The urine sample is positive for infection I have sent an antibiotic Macrobid take it twice a day for 5 days. Cholesterol has been improved compared to the previous lab, Liver function elevated, Continue to watch diet and exercise regimen. Hemoglobin A1c increased from 7.5 to 9.8, urine micro albumin on higher side. Will repeat in 1 month.  Continue diet management and regular exercise Kidney functions are normal

## 2023-03-01 ENCOUNTER — Other Ambulatory Visit: Payer: Self-pay | Admitting: Nurse Practitioner

## 2023-03-01 DIAGNOSIS — R809 Proteinuria, unspecified: Secondary | ICD-10-CM

## 2023-03-02 ENCOUNTER — Other Ambulatory Visit (HOSPITAL_COMMUNITY): Payer: Self-pay

## 2023-03-02 ENCOUNTER — Telehealth: Payer: Self-pay

## 2023-03-02 NOTE — Telephone Encounter (Signed)
PA has been initiated in [facility], currently waiting on clinical questions to populate in order to submit, documenting in separate telephone encounter

## 2023-03-02 NOTE — Telephone Encounter (Signed)
Pharmacy Patient Advocate Encounter   Received notification from Pt Calls Messages that prior authorization for Mounjaro 5MG /0.5ML auto-injectors is required/requested.   Insurance verification completed.   The patient is insured through [employer] [facility] .   Per test claim: PA required; PA started via CoverMyMeds. KEY [id number] . Waiting for clinical questions to populate.

## 2023-03-02 NOTE — Telephone Encounter (Signed)
Noted  

## 2023-03-04 NOTE — Telephone Encounter (Signed)
Clinical questions answered and submitted to insurance

## 2023-03-09 ENCOUNTER — Other Ambulatory Visit (HOSPITAL_COMMUNITY): Payer: Self-pay

## 2023-03-09 NOTE — Telephone Encounter (Signed)
Pharmacy Patient Advocate Encounter  Received notification from [employer] [facility] that Prior Authorization for Mounjaro 5MG /0.5ML auto-injectors  has been APPROVED from 03/04/23 to 03/03/26. Spoke to pharmacy to process.Copay is $70.00 for 3 months.    PA #/Case ID/Reference #: [id number]

## 2023-03-12 NOTE — Progress Notes (Incomplete)
Established Patient Office Visit  Subjective:  Patient ID: Jessica Richardson, female    DOB: 12-22-1970  Age: 52 y.o. MRN: [id number]  CC:  Chief Complaint  Patient presents with  . Medical Management of Chronic Issues    Discuss weight loss      HPI  Jessica Richardson presents for chronic disease management.  She has history of diabetes, hypertension, hyperlipidemia and arthritis.  Patient patient recently got injured at work and has tendon repair surgery of left shoulder. Patient states that she has been doing diet management and exercise for things like her weight is not coming down with Ozempic.  She would like to switch to [facility]. She is overall doing well no new concerns at present.  Denies any abdominal pain, diarrhea, constipation, nausea vomiting.  HPI   Past Medical History:  Diagnosis Date  . Arthritis   . Depression   . Diabetes mellitus   . Hip arthritis, left, post-SCFE 03/29/2011    Past Surgical History:  Procedure Laterality Date  . ABDOMINAL HYSTERECTOMY    . APPENDECTOMY    . CESAREAN SECTION    . COLONOSCOPY WITH PROPOFOL N/A 02/05/2022   Procedure: COLONOSCOPY WITH PROPOFOL;  Surgeon: Wyline Mood, MD;  Location: [facility] ENDOSCOPY;  Service: Gastroenterology;  Laterality: N/A;  . FRACTURE SURGERY    . JOINT REPLACEMENT     pins both hips  . TIBIA IM NAIL INSERTION Right 05/15/2012   Procedure: Open Reduction Internal Fixation Right Tibia;  Surgeon: Eulas Post, MD;  Location: [facility] OR;  Service: Orthopedics;  Laterality: Right;  . TONSILLECTOMY    . TOTAL HIP ARTHROPLASTY  03/29/2011   Procedure: TOTAL HIP ARTHROPLASTY;  Surgeon: Eulas Post;  Location: [facility] OR;  Service: Orthopedics;  Laterality: Left;  with removal of retained hardware (knowles pins)    Family History  Problem Relation Age of Onset  . Breast cancer Maternal Aunt     Social History   Socioeconomic History  . Marital status: Single    Spouse name: Not on file  . Number  of children: Not on file  . Years of education: Not on file  . Highest education level: Not on file  Occupational History  . Not on file  Tobacco Use  . Smoking status: Every Day    Types: Cigars    Last attempt to quit: 03/24/2011    Years since quitting: 11.9  . Smokeless tobacco: Never  . Tobacco comments:    Trying to quit. There are days she does not smoke at all. She smokes two cigarettes a week.   Vaping Use  . Vaping status: Never Used  Substance and Sexual Activity  . Alcohol use: No    Comment: rarely  . Drug use: No  . Sexual activity: Never  Other Topics Concern  . Not on file  Social History Narrative  . Not on file   Social Drivers of Health   Financial Resource Strain: Not on file  Food Insecurity: Not on file  Transportation Needs: Not on file  Physical Activity: Not on file  Stress: Not on file  Social Connections: Not on file  Intimate Partner Violence: Not on file     Outpatient Medications Prior to Visit  Medication Sig Dispense Refill  . blood glucose meter kit and supplies Dispense based on patient and insurance preference. Use up to four times daily as directed. (FOR ICD-10 E10.9, E11.9). 1 each 0  . glipiZIDE (GLUCOTROL) 5 MG tablet TAKE  1 TABLET (5 MG TOTAL) BY MOUTH TWICE A DAY BEFORE MEALS 180 tablet 1  . ibuprofen (ADVIL,MOTRIN) 600 MG tablet Take 1 tablet (600 mg total) by mouth every 6 (six) hours as needed. 30 tablet 0  . losartan-hydrochlorothiazide (HYZAAR) 50-12.5 MG tablet TAKE 1 TABLET BY MOUTH EVERY DAY 90 tablet 1  . meloxicam (MOBIC) 7.5 MG tablet Take 7.5 mg by mouth daily.    . naproxen (NAPROSYN) 500 MG tablet Take 500 mg by mouth 2 (two) times daily as needed.    Marland Kitchen omeprazole (PRILOSEC) 40 MG capsule TAKE 1 CAPSULE (40 MG TOTAL) BY MOUTH DAILY. 90 capsule 1  . rosuvastatin (CRESTOR) 10 MG tablet Take 1 tablet (10 mg total) by mouth daily. 90 tablet 3  . Semaglutide,0.25 or 0.5MG /DOS, (OZEMPIC, 0.25 OR 0.5 MG/DOSE,) 2 MG/3ML SOPN  Inject 0.5 mg into the skin once a week. 9 mL 2  . methylPREDNISolone (MEDROL DOSEPAK) 4 MG TBPK tablet Take as directed 21 each 0   No facility-administered medications prior to visit.    Allergies  Allergen Reactions  . Metformin And Related     GI Upset  . Penicillins Hives    ROS Review of Systems  Constitutional:  Positive for fatigue.  HENT: Negative.    Eyes: Negative.   Respiratory:  Negative for chest tightness and shortness of breath.   Cardiovascular:  Positive for leg swelling.  Gastrointestinal: Negative.        But smells like sulfur  Genitourinary: Negative.   Musculoskeletal: Negative.        Injury to right hand  Neurological: Negative.   Psychiatric/Behavioral: Negative.        Objective:    Physical Exam Constitutional:      Appearance: Normal appearance. She is obese.  HENT:     Head: Normocephalic.     Right Ear: Tympanic membrane normal.     Left Ear: Tympanic membrane normal.     Nose: Nose normal.     Mouth/Throat:     Mouth: Mucous membranes are moist.     Pharynx: Oropharynx is clear.  Eyes:     Extraocular Movements: Extraocular movements intact.     Conjunctiva/sclera: Conjunctivae normal.     Pupils: Pupils are equal, round, and reactive to light.  Cardiovascular:     Rate and Rhythm: Normal rate and regular rhythm.     Pulses: Normal pulses.     Heart sounds: Normal heart sounds.  Pulmonary:     Effort: Pulmonary effort is normal. No respiratory distress.     Breath sounds: Normal breath sounds. No rhonchi.  Abdominal:     General: Bowel sounds are normal.     Palpations: Abdomen is soft. There is no mass.     Tenderness: There is no abdominal tenderness.     Hernia: No hernia is present.  Musculoskeletal:        General: Swelling (right ankel> left ankel) and tenderness (Right hand: splint) present. No deformity or signs of injury.     Cervical back: Neck supple. No tenderness.  Skin:    General: Skin is warm.      Capillary Refill: Capillary refill takes less than 2 seconds.  Neurological:     General: No focal deficit present.     Mental Status: She is alert and oriented to person, place, and time. Mental status is at baseline.  Psychiatric:        Mood and Affect: Mood normal.  Behavior: Behavior normal.        Thought Content: Thought content normal.        Judgment: Judgment normal.     BP (!) 142/82   Pulse 82   Temp 98.4 F (36.9 C) (Oral)   Ht 5\' 4"  (1.626 m)   Wt 296 lb (134.3 kg)   LMP 07/17/2002 (Approximate)   SpO2 98%   BMI 50.81 kg/m  Wt Readings from Last 3 Encounters:  02/25/23 296 lb (134.3 kg)  07/08/22 283 lb (128.4 kg)  04/02/22 285 lb (129.3 kg)     Health Maintenance  Topic Date Due  . FOOT EXAM  Never done  . OPHTHALMOLOGY EXAM  Never done  . HIV Screening  Never done  . Zoster Vaccines- Shingrix (1 of 2) Never done  . COVID-19 Vaccine (1 - 2024-25 season) Never done  . INFLUENZA VACCINE  06/20/2023 (Originally 10/21/2022)  . HEMOGLOBIN A1C  08/26/2023  . MAMMOGRAM  12/18/2023  . Diabetic kidney evaluation - eGFR measurement  02/25/2024  . Diabetic kidney evaluation - Urine ACR  02/25/2024  . Cervical Cancer Screening (HPV/Pap Cotest)  10/22/2024  . Colonoscopy  02/05/2029  . DTaP/Tdap/Td (2 - Td or Tdap) 10/23/2031  . Hepatitis C Screening  Completed  . HPV VACCINES  Aged Out    There are no preventive care reminders to display for this patient.  Lab Results  Component Value Date   TSH 1.81 07/09/2022   Lab Results  Component Value Date   WBC 5.5 02/25/2023   HGB 12.8 02/25/2023   HCT 36.5 02/25/2023   MCV 95.1 02/25/2023   PLT 275.0 02/25/2023   Lab Results  Component Value Date   NA 142 02/25/2023   K 4.0 02/25/2023   CO2 29 02/25/2023   GLUCOSE 181 (H) 02/25/2023   BUN 19 02/25/2023   CREATININE 1.01 02/25/2023   BILITOT 0.7 02/25/2023   ALKPHOS 135 (H) 02/25/2023   AST 14 02/25/2023   ALT 13 02/25/2023   PROT 7.1  02/25/2023   ALBUMIN 3.7 02/25/2023   CALCIUM 8.7 02/25/2023   EGFR 73 10/23/2021   GFR 63.99 02/25/2023   Lab Results  Component Value Date   CHOL 189 02/25/2023   Lab Results  Component Value Date   HDL 59.60 02/25/2023   Lab Results  Component Value Date   LDLCALC 106 (H) 02/25/2023   Lab Results  Component Value Date   TRIG 116.0 02/25/2023   Lab Results  Component Value Date   CHOLHDL 3 02/25/2023   Lab Results  Component Value Date   HGBA1C 9.8 (H) 02/25/2023      Assessment & Plan:   Problem List Items Addressed This Visit       Unprioritized   Type 2 diabetes mellitus without complication, without long-term current use of insulin (HCC) - Primary   Relevant Medications   tirzepatide (MOUNJARO) 5 MG/0.5ML Pen   Other Relevant Orders   Hemoglobin A1c (Completed)   Urine Albumin/Creatinine with ratio (send out) [LAB689] (Completed)   Hyperlipidemia   Relevant Orders   Lipid panel (Completed)   Other Visit Diagnoses       Hypertension, unspecified type       Relevant Orders   CBC with Differential/Platelet (Completed)   Comprehensive metabolic panel (Completed)     Urinary frequency       Relevant Orders   Urine Culture (Completed)   Urinalysis, Routine w reflex microscopic (Completed)  Meds ordered this encounter  Medications  . tirzepatide [facility]) 5 MG/0.5ML Pen    Sig: Inject 5 mg into the skin once a week.    Dispense:  6 mL    Refill:  2     Follow-up: Return in about 3 months (around 05/26/2023) for chronic management.    Kara Dies, NP

## 2023-03-13 DIAGNOSIS — N3000 Acute cystitis without hematuria: Secondary | ICD-10-CM | POA: Insufficient documentation

## 2023-03-13 DIAGNOSIS — R350 Frequency of micturition: Secondary | ICD-10-CM | POA: Insufficient documentation

## 2023-03-13 NOTE — Assessment & Plan Note (Signed)
Advised patient to healthy diet and follow regular exercise schedule. Will stop Ozempic and start on Mounjaro once a week. Continue glipizide daily Will check hemoglobin A1c

## 2023-03-13 NOTE — Assessment & Plan Note (Addendum)
Patient BP  Vitals:   02/25/23 1201 02/25/23 1203  BP: (!) 160/100 (!) 142/82    in the office. Advised pt to follow a low sodium and heart healthy diet. Encourage patient to consume low-salt heart, healthy diet and follow regular exercise schedule. Continue Losartan-hydrochlorothiazide 50-12.5 mg. She will monitor blood pressure for 2-week and  will send the numbers via MyChart, if blood pressure remains elevated would adjust medication regimen. Will check CBC, CMP

## 2023-03-13 NOTE — Assessment & Plan Note (Signed)
Diet and exercise discussed. Will check lipid panel

## 2023-03-13 NOTE — Assessment & Plan Note (Signed)
Urine culture positive for E. coli. Will treat with Macrobid. Patient would let us know if symptoms not improving.

## 2023-03-13 NOTE — Assessment & Plan Note (Signed)
Body mass index is 50.81 kg/m. Advised pt to lose weight. Advised patient to avoid trans fat, fatty and fried food. Follow a regular physical activity schedule. Went over the risk of chronic diseases with increased weight.

## 2023-03-31 ENCOUNTER — Other Ambulatory Visit: Payer: 59

## 2023-03-31 DIAGNOSIS — R809 Proteinuria, unspecified: Secondary | ICD-10-CM

## 2023-03-31 LAB — MICROALBUMIN / CREATININE URINE RATIO
Creatinine,U: 205.1 mg/dL
Microalb Creat Ratio: 0.9 mg/g (ref 0.0–30.0)
Microalb, Ur: 1.9 mg/dL (ref 0.0–1.9)

## 2023-05-04 ENCOUNTER — Telehealth: Payer: Self-pay

## 2023-05-04 NOTE — Telephone Encounter (Signed)
Patient was identified as falling into the [place] Measure - Diabetes.   Patient was: Appointment scheduled with primary care provider in the next 30 days.

## 2023-05-26 ENCOUNTER — Encounter: Payer: Self-pay | Admitting: Nurse Practitioner

## 2023-05-26 ENCOUNTER — Ambulatory Visit: Payer: 59 | Admitting: Nurse Practitioner

## 2023-05-26 VITALS — BP 126/82 | HR 84 | Temp 98.0°F | Ht 64.0 in | Wt 293.4 lb

## 2023-05-26 DIAGNOSIS — E785 Hyperlipidemia, unspecified: Secondary | ICD-10-CM

## 2023-05-26 DIAGNOSIS — E1165 Type 2 diabetes mellitus with hyperglycemia: Secondary | ICD-10-CM | POA: Diagnosis not present

## 2023-05-26 DIAGNOSIS — R748 Abnormal levels of other serum enzymes: Secondary | ICD-10-CM

## 2023-05-26 DIAGNOSIS — I10 Essential (primary) hypertension: Secondary | ICD-10-CM

## 2023-05-26 DIAGNOSIS — Z0000 Encounter for general adult medical examination without abnormal findings: Secondary | ICD-10-CM

## 2023-05-26 MED ORDER — GLIPIZIDE 5 MG PO TABS: 5.0000 mg | ORAL_TABLET | Freq: Two times a day (BID) | ORAL | 1 refills | Status: DC

## 2023-05-26 MED ORDER — TIRZEPATIDE 7.5 MG/0.5ML ~~LOC~~ SOAJ: 7.5000 mg | SUBCUTANEOUS | 2 refills | Status: DC

## 2023-05-26 NOTE — Progress Notes (Signed)
 Established Patient Office Visit  Subjective:  Patient ID: Jessica Richardson, female    DOB: [birth year]  Age: 53 y.o. MRN: [id number]  CC:  Chief Complaint  Patient presents with   Medical Management of Chronic Issues   Discussed the use of a AI scribe software for clinical note transcription with the patient, who gave verbal consent to proceed.  HPI  Jessica Richardson is a 53 year old female with history of diabetes, GERD, hypertension, hyperlipidemia and arthritis here for chronic disease follow-up.  She underwent tendon release surgery, which went well, but her recovery in therapy is progressing slowly. She lacks full range of motion, and there is concern that further intervention may be needed if the muscle does not loosen up. She describes the recovery as 'slow and steady but getting better'.  She is taking glipizide 5 mg once daily and Mounjaro weekly. She monitors her blood pressure at home, noting it is typically 136/82 mmHg, but lower in the mornings at 120/75 mmHg.  She has almost quit smoking, currently smoking half a jazz cigar a day. She engages in walking for exercise and tries to take time for herself, although she experiences stress and anxiety, which sometimes leads to stress headaches.  She experiences bloating with high protein intake, particularly red meat, which she has reduced. She uses a stool softener every other week due to irregular bowel movements, though she does not feel constipated.  HPI   Past Medical History:  Diagnosis Date   Arthritis    Depression    Diabetes mellitus    Hip arthritis, left, post-SCFE 03/29/2011    Past Surgical History:  Procedure Laterality Date   ABDOMINAL HYSTERECTOMY     APPENDECTOMY     CESAREAN SECTION     COLONOSCOPY WITH PROPOFOL N/A 02/05/2022   Procedure: COLONOSCOPY WITH PROPOFOL;  Surgeon: Wyline Mood, MD;  Location: [facility] ENDOSCOPY;  Service: Gastroenterology;  Laterality: N/A;   FRACTURE SURGERY     JOINT  REPLACEMENT     pins both hips   TIBIA IM NAIL INSERTION Right 05/15/2012   Procedure: Open Reduction Internal Fixation Right Tibia;  Surgeon: Eulas Post, MD;  Location: [facility] OR;  Service: Orthopedics;  Laterality: Right;   TONSILLECTOMY     TOTAL HIP ARTHROPLASTY  03/29/2011   Procedure: TOTAL HIP ARTHROPLASTY;  Surgeon: Eulas Post;  Location: [facility] OR;  Service: Orthopedics;  Laterality: Left;  with removal of retained hardware (knowles pins)    Family History  Problem Relation Age of Onset   Breast cancer Maternal Aunt     Social History   Socioeconomic History   Marital status: Single    Spouse name: Not on file   Number of children: Not on file   Years of education: Not on file   Highest education level: Not on file  Occupational History   Not on file  Tobacco Use   Smoking status: Every Day    Types: Cigars    Last attempt to quit: 03/24/2011    Years since quitting: 12.2   Smokeless tobacco: Never   Tobacco comments:    Trying to quit. There are days she does not smoke at all. She smokes two cigarettes a week.   Vaping Use   Vaping status: Never Used  Substance and Sexual Activity   Alcohol use: No    Comment: rarely   Drug use: No   Sexual activity: Never  Other Topics Concern   Not on file  Social History Narrative   Not on file   Social Drivers of Health   Financial Resource Strain: Not on file  Food Insecurity: Not on file  Transportation Needs: Not on file  Physical Activity: Not on file  Stress: Not on file  Social Connections: Not on file  Intimate Partner Violence: Not on file     Outpatient Medications Prior to Visit  Medication Sig Dispense Refill   blood glucose meter kit and supplies Dispense based on patient and insurance preference. Use up to four times daily as directed. (FOR ICD-10 E10.9, E11.9). 1 each 0   ibuprofen (ADVIL,MOTRIN) 600 MG tablet Take 1 tablet (600 mg total) by mouth every 6 (six) hours as needed. 30 tablet 0    losartan-hydrochlorothiazide (HYZAAR) 50-12.5 MG tablet TAKE 1 TABLET BY MOUTH EVERY DAY 90 tablet 1   meloxicam (MOBIC) 7.5 MG tablet Take 7.5 mg by mouth daily.     methylPREDNISolone (MEDROL DOSEPAK) 4 MG TBPK tablet Take as directed 21 each 0   naproxen (NAPROSYN) 500 MG tablet Take 500 mg by mouth 2 (two) times daily as needed.     nitrofurantoin, macrocrystal-monohydrate, (MACROBID) 100 MG capsule Take 1 capsule (100 mg total) by mouth 2 (two) times daily. Take with food. 10 capsule 0   omeprazole (PRILOSEC) 40 MG capsule TAKE 1 CAPSULE (40 MG TOTAL) BY MOUTH DAILY. 90 capsule 1   rosuvastatin (CRESTOR) 10 MG tablet Take 1 tablet (10 mg total) by mouth daily. 90 tablet 3   glipiZIDE (GLUCOTROL) 5 MG tablet TAKE 1 TABLET (5 MG TOTAL) BY MOUTH TWICE A DAY BEFORE MEALS 180 tablet 1   tirzepatide (MOUNJARO) 5 MG/0.5ML Pen Inject 5 mg into the skin once a week. 6 mL 2   No facility-administered medications prior to visit.    Allergies  Allergen Reactions   Metformin And Related     GI Upset   Penicillins Hives    ROS Review of Systems Negative unless indicated in HPI.    Objective:    Physical Exam  BP 126/82   Pulse 84   Temp 98 F (36.7 C)   Ht 5\' 4"  (1.626 m)   Wt 293 lb 6.4 oz (133.1 kg)   LMP 07/17/2002 (Approximate)   SpO2 91%   BMI 50.36 kg/m  Wt Readings from Last 3 Encounters:  05/26/23 293 lb 6.4 oz (133.1 kg)  02/25/23 296 lb (134.3 kg)  07/08/22 283 lb (128.4 kg)     Health Maintenance  Topic Date Due   Pneumococcal Vaccine 68-5 Years old (1 of 2 - PCV) Never done   FOOT EXAM  Never done   OPHTHALMOLOGY EXAM  Never done   HIV Screening  Never done   Zoster Vaccines- Shingrix (1 of 2) Never done   COVID-19 Vaccine (1 - 2024-25 season) 06/09/2023 (Originally 11/21/2022)   INFLUENZA VACCINE  06/20/2023 (Originally 10/21/2022)   HEMOGLOBIN A1C  08/26/2023   MAMMOGRAM  12/18/2023   Diabetic kidney evaluation - eGFR measurement  02/25/2024   Diabetic  kidney evaluation - Urine ACR  03/30/2024   Cervical Cancer Screening (HPV/Pap Cotest)  10/22/2024   Colonoscopy  02/05/2029   DTaP/Tdap/Td (2 - Td or Tdap) 10/23/2031   Hepatitis C Screening  Completed   HPV VACCINES  Aged Out    There are no preventive care reminders to display for this patient.  Lab Results  Component Value Date   TSH 1.81 07/09/2022   Lab Results  Component Value Date  WBC 5.5 02/25/2023   HGB 12.8 02/25/2023   HCT 36.5 02/25/2023   MCV 95.1 02/25/2023   PLT 275.0 02/25/2023   Lab Results  Component Value Date   NA 142 02/25/2023   K 4.0 02/25/2023   CO2 29 02/25/2023   GLUCOSE 181 (H) 02/25/2023   BUN 19 02/25/2023   CREATININE 1.01 02/25/2023   BILITOT 0.7 02/25/2023   ALKPHOS 135 (H) 02/25/2023   AST 14 02/25/2023   ALT 13 02/25/2023   PROT 7.1 02/25/2023   ALBUMIN 3.7 02/25/2023   CALCIUM 8.7 02/25/2023   EGFR 73 10/23/2021   GFR 63.99 02/25/2023   Lab Results  Component Value Date   CHOL 189 02/25/2023   Lab Results  Component Value Date   HDL 59.60 02/25/2023   Lab Results  Component Value Date   LDLCALC 106 (H) 02/25/2023   Lab Results  Component Value Date   TRIG 116.0 02/25/2023   Lab Results  Component Value Date   CHOLHDL 3 02/25/2023   Lab Results  Component Value Date   HGBA1C 9.8 (H) 02/25/2023      Assessment & Plan:  Type 2 diabetes mellitus with hyperglycemia, without long-term current use of insulin (HCC) Assessment & Plan: Lab Results  Component Value Date   HGBA1C 9.8 (H) 02/25/2023  -Continue glipizide 5 mg twice daily. Increased Mounjaro to 7.5 Mg  weekly aid in glycemic control. - Refill glipizide prescription. -Will check hemoglobin A1c   Orders: -     Hemoglobin A1c; Future -     Tirzepatide; Inject 7.5 mg into the skin once a week.  Dispense: 6 mL; Refill: 2  Elevated liver enzymes Assessment & Plan:    Latest Ref Rng & Units 02/25/2023   12:35 PM 07/09/2022   10:30 AM 10/23/2021    9:34  AM  Hepatic Function  Total Protein 6.0 - 8.3 g/dL 7.1  6.6  6.9   Albumin 3.5 - 5.2 g/dL 3.7  3.6    AST 0 - 37 U/L 14  12  13    ALT 0 - 35 U/L 13  10  11    Alk Phosphatase 39 - 117 U/L 135  130    Total Bilirubin 0.2 - 1.2 mg/dL 0.7  0.6  1.1   -Previous labs showed elevated alkaline phosphatase. - Will repeat liver enzyme test.    Orders: -     Hepatic function panel; Future  Hypertension, unspecified type Assessment & Plan: Blood pressure readings are variable.  - Track blood pressure for two weeks and send readings via MyChart. - Consider starting amlodipine if blood pressure remains elevated.   Annual physical exam  Hyperlipidemia, unspecified hyperlipidemia type Assessment & Plan: Lipid Panel     Component Value Date/Time   CHOL 189 02/25/2023 1235   TRIG 116.0 02/25/2023 1235   HDL 59.60 02/25/2023 1235   CHOLHDL 3 02/25/2023 1235   VLDL 23.2 02/25/2023 1235   LDLCALC 106 (H) 02/25/2023 1235   LDLCALC 171 (H) 10/23/2021 0934  -Continue diet management and statin therapy    Other orders -     glipiZIDE; Take 1 tablet (5 mg total) by mouth 2 (two) times daily before a meal.  Dispense: 180 tablet; Refill: 1    Follow-up: Return in about 3 months (around 08/26/2023) for physical, non fasting lab in 1 week.   Kara Dies, NP

## 2023-05-26 NOTE — Patient Instructions (Addendum)
 Please schedule non fasting lab in a week and physical in 3 month. Your blood pressure readings have been variable. We discussed starting a new medication, amlodipine, if your blood pressure remains elevated. Please track your blood pressure for two weeks and send the readings via mychart.

## 2023-05-30 ENCOUNTER — Telehealth: Payer: Self-pay

## 2023-05-30 NOTE — Telephone Encounter (Signed)
 Patient was identified as falling into the [facility] [place] Measure - Diabetes.   Patient was: Appointment scheduled for lab or office visit for A1c.  Lab appointment 06/16/23

## 2023-06-04 ENCOUNTER — Encounter: Payer: Self-pay | Admitting: Nurse Practitioner

## 2023-06-04 DIAGNOSIS — Z0000 Encounter for general adult medical examination without abnormal findings: Secondary | ICD-10-CM | POA: Insufficient documentation

## 2023-06-04 NOTE — Assessment & Plan Note (Signed)
 Lipid Panel     Component Value Date/Time   CHOL 189 02/25/2023 1235   TRIG 116.0 02/25/2023 1235   HDL 59.60 02/25/2023 1235   CHOLHDL 3 02/25/2023 1235   VLDL 23.2 02/25/2023 1235   LDLCALC 106 (H) 02/25/2023 1235   LDLCALC 171 (H) 10/23/2021 0934  -Continue diet management and statin therapy

## 2023-06-04 NOTE — Assessment & Plan Note (Signed)
 Blood pressure readings are variable.  - Track blood pressure for two weeks and send readings via MyChart. - Consider starting amlodipine if blood pressure remains elevated.

## 2023-06-04 NOTE — Assessment & Plan Note (Signed)
 Lab Results  Component Value Date   HGBA1C 9.8 (H) 02/25/2023  -Continue glipizide 5 mg twice daily. Increased Mounjaro to 7.5 Mg  weekly aid in glycemic control. - Refill glipizide prescription. -Will check hemoglobin A1c

## 2023-06-04 NOTE — Assessment & Plan Note (Signed)
    Latest Ref Rng & Units 02/25/2023   12:35 PM 07/09/2022   10:30 AM 10/23/2021    9:34 AM  Hepatic Function  Total Protein 6.0 - 8.3 g/dL 7.1  6.6  6.9   Albumin 3.5 - 5.2 g/dL 3.7  3.6    AST 0 - 37 U/L 14  12  13    ALT 0 - 35 U/L 13  10  11    Alk Phosphatase 39 - 117 U/L 135  130    Total Bilirubin 0.2 - 1.2 mg/dL 0.7  0.6  1.1   -Previous labs showed elevated alkaline phosphatase. - Will repeat liver enzyme test.

## 2023-06-16 ENCOUNTER — Other Ambulatory Visit (INDEPENDENT_AMBULATORY_CARE_PROVIDER_SITE_OTHER)

## 2023-06-16 DIAGNOSIS — E1165 Type 2 diabetes mellitus with hyperglycemia: Secondary | ICD-10-CM | POA: Diagnosis not present

## 2023-06-16 DIAGNOSIS — R748 Abnormal levels of other serum enzymes: Secondary | ICD-10-CM

## 2023-06-16 LAB — HEPATIC FUNCTION PANEL
ALT: 14 U/L (ref 0–35)
AST: 11 U/L (ref 0–37)
Albumin: 3.8 g/dL (ref 3.5–5.2)
Alkaline Phosphatase: 139 U/L — ABNORMAL HIGH (ref 39–117)
Bilirubin, Direct: 0.1 mg/dL (ref 0.0–0.3)
Total Bilirubin: 0.7 mg/dL (ref 0.2–1.2)
Total Protein: 6.8 g/dL (ref 6.0–8.3)

## 2023-06-17 DIAGNOSIS — H52223 Regular astigmatism, bilateral: Secondary | ICD-10-CM | POA: Diagnosis not present

## 2023-06-17 DIAGNOSIS — H47322 Drusen of optic disc, left eye: Secondary | ICD-10-CM | POA: Diagnosis not present

## 2023-06-17 DIAGNOSIS — H2513 Age-related nuclear cataract, bilateral: Secondary | ICD-10-CM | POA: Diagnosis not present

## 2023-06-17 DIAGNOSIS — E119 Type 2 diabetes mellitus without complications: Secondary | ICD-10-CM | POA: Diagnosis not present

## 2023-06-17 DIAGNOSIS — H25013 Cortical age-related cataract, bilateral: Secondary | ICD-10-CM | POA: Diagnosis not present

## 2023-06-18 LAB — HEMOGLOBIN A1C: Hgb A1c MFr Bld: 8.3 % — ABNORMAL HIGH (ref 4.6–6.5)

## 2023-06-20 ENCOUNTER — Encounter: Payer: Self-pay | Admitting: Nurse Practitioner

## 2023-06-20 ENCOUNTER — Other Ambulatory Visit: Payer: Self-pay | Admitting: Nurse Practitioner

## 2023-06-20 ENCOUNTER — Other Ambulatory Visit (INDEPENDENT_AMBULATORY_CARE_PROVIDER_SITE_OTHER): Payer: Self-pay | Admitting: Pharmacist

## 2023-06-20 DIAGNOSIS — Z7985 Long-term (current) use of injectable non-insulin antidiabetic drugs: Secondary | ICD-10-CM

## 2023-06-20 DIAGNOSIS — E119 Type 2 diabetes mellitus without complications: Secondary | ICD-10-CM

## 2023-06-20 DIAGNOSIS — R748 Abnormal levels of other serum enzymes: Secondary | ICD-10-CM

## 2023-06-20 NOTE — Progress Notes (Signed)
 Chart Review:  Date of review: 06/20/23 Patient was identified as falling into the True [facility] for Diabetes control.   Last A1c: 8.3% (06/16/23) Next A1c Due: 09/14/23 Next Scheduled Visit: 08/26/23  PCP visit December: Transtion form Ozempic 0.5 to [facility] 5 Last Dispensed Mounjaro 5 mg on 03/06/24 x84 day supply.  PCP visit: Increase Mounjaro to 7.5 mg weekly. No record of patient filling this at the pharmacy.  Patient refilled Ozempic 0.25/0/5 mg on 05/16/23 for 28 day supply. No refills remaining.  No GLP1 refill since 05/16/23  Patient confirms she is still taking Mounjaro. Reports she is using 5 mg though is not home at the time of phone call to check. Denies adverse effects or other concerns with Mounjaro at this time. Notes cost at the pharmacy has been affordable in 2025.   Called Patient's Pharmacy:  $70 for her last fill (84 day supply in [place]) Enrolled patient in savings card and provided to her pharmacy [facility] SAVINGS CARD RXBIN: [id number] PCN: [id number] GRP: [id number] ID: [id number] Expiration Date: 03/21/2024 Pharmacy confirms new copay will be $25 for 84 day supply. They have to order the 7.5 mg dose. Patient will be messaged when ready.   Follow Up:  Pharm chart review: 1 month  Future Appointments  Date Time Provider Department Center  08/26/2023  9:00 AM Kara Dies, NP [id number]

## 2023-07-11 LAB — HM DIABETES EYE EXAM

## 2023-07-20 ENCOUNTER — Other Ambulatory Visit: Payer: Self-pay | Admitting: Nurse Practitioner

## 2023-07-20 DIAGNOSIS — E119 Type 2 diabetes mellitus without complications: Secondary | ICD-10-CM

## 2023-08-21 ENCOUNTER — Other Ambulatory Visit: Payer: Self-pay | Admitting: Nurse Practitioner

## 2023-08-21 DIAGNOSIS — E785 Hyperlipidemia, unspecified: Secondary | ICD-10-CM

## 2023-08-26 ENCOUNTER — Encounter: Admitting: Nurse Practitioner

## 2023-08-31 ENCOUNTER — Telehealth: Payer: Self-pay | Admitting: Nurse Practitioner

## 2023-08-31 NOTE — Telephone Encounter (Signed)
 Unable to lm. The office tried to call, but there is no voice mail setup. Patient's appointment on 09/01/2023 has been canceled, Jessica Richardson will not be in the office. A MyChart message was also sent to patient.  E2C2 please reschedule appointment.

## 2023-09-01 ENCOUNTER — Encounter: Admitting: Nurse Practitioner

## 2023-09-05 ENCOUNTER — Telehealth: Payer: Self-pay

## 2023-09-05 NOTE — Telephone Encounter (Signed)
 I left a voicemail for patient asking her to please call us  back to reschedule her 09/08/2023 appointment with Tona Francis, NP.  I also sent a message to patient via MyChart.  E2C2 - when patient calls back, please assist her with rescheduling this visit.

## 2023-09-06 ENCOUNTER — Encounter: Payer: Self-pay | Admitting: Pharmacist

## 2023-09-06 NOTE — Progress Notes (Signed)
 Chart Review:  Date of review: 09/06/23 Patient was identified as falling into the True [facility] for Diabetes control.   Last A1c: 8.3% (06/16/23) Next A1c Due: 09/14/23 Next Scheduled Visit: N/A - patient has been contacted for reschedule earlier this week  Pharm Interventions: 09/06/23:  Reviewed claims data. Confirmed Mounjaro  7.5 mg was filled successfully on 06/20/23 for 84 day supply.  2 additional refills remain on 7.5 mg dose. Next refill due: 09/12/23 should be out of medication   March: $70 for her last fill (84 day supply in [place]) Enrolled patient in savings card and provided to her pharmacy. New copay = $25 for 90 ds. Has NOT stated 7.5 mg dose.  MOUNJARO  SAVINGS CARD RXBIN: [id number] PCN: [id number] GRP: [id number] ID: [id number] Expiration Date: 03/21/2024  No future appointments.

## 2023-09-08 ENCOUNTER — Encounter: Admitting: Nurse Practitioner

## 2023-10-11 ENCOUNTER — Encounter: Payer: Self-pay | Admitting: Nurse Practitioner

## 2023-10-11 ENCOUNTER — Ambulatory Visit: Admitting: Nurse Practitioner

## 2023-10-11 VITALS — BP 128/80 | HR 87 | Temp 98.2°F | Ht 64.0 in | Wt 290.2 lb

## 2023-10-11 DIAGNOSIS — Z1231 Encounter for screening mammogram for malignant neoplasm of breast: Secondary | ICD-10-CM

## 2023-10-11 DIAGNOSIS — R748 Abnormal levels of other serum enzymes: Secondary | ICD-10-CM | POA: Diagnosis not present

## 2023-10-11 DIAGNOSIS — Z7984 Long term (current) use of oral hypoglycemic drugs: Secondary | ICD-10-CM | POA: Diagnosis not present

## 2023-10-11 DIAGNOSIS — Z0000 Encounter for general adult medical examination without abnormal findings: Secondary | ICD-10-CM

## 2023-10-11 DIAGNOSIS — E785 Hyperlipidemia, unspecified: Secondary | ICD-10-CM

## 2023-10-11 DIAGNOSIS — E1165 Type 2 diabetes mellitus with hyperglycemia: Secondary | ICD-10-CM | POA: Diagnosis not present

## 2023-10-11 MED ORDER — TIRZEPATIDE 10 MG/0.5ML ~~LOC~~ SOAJ
10.0000 mg | SUBCUTANEOUS | 3 refills | Status: DC
Start: 2023-10-11 — End: 2024-02-14

## 2023-10-11 MED ORDER — LOSARTAN POTASSIUM-HCTZ 50-12.5 MG PO TABS: 1.0000 | ORAL_TABLET | Freq: Every day | ORAL | 3 refills | Status: DC

## 2023-10-11 MED ORDER — ROSUVASTATIN CALCIUM 20 MG PO TABS: 20.0000 mg | ORAL_TABLET | Freq: Every day | ORAL | 3 refills | Status: DC

## 2023-10-11 MED ORDER — GLIPIZIDE 5 MG PO TABS: 5.0000 mg | ORAL_TABLET | Freq: Two times a day (BID) | ORAL | 3 refills | Status: DC

## 2023-10-11 MED ORDER — TIRZEPATIDE 7.5 MG/0.5ML ~~LOC~~ SOAJ: 7.5000 mg | SUBCUTANEOUS | 2 refills | Status: DC

## 2023-10-11 MED ORDER — OMEPRAZOLE 40 MG PO CPDR: 40.0000 mg | DELAYED_RELEASE_CAPSULE | Freq: Every day | ORAL | 1 refills | Status: DC

## 2023-10-11 NOTE — Progress Notes (Signed)
 Established Patient Office Visit  Subjective:  Patient ID: Jessica Richardson, female    DOB: 1970-11-12  Age: 53 y.o. MRN: [id number]  CC:  Chief Complaint  Patient presents with   Annual Exam    HPI  Jessica Richardson presents for annual physical.   Diet: Patient eat chicken, sea food and [place], occasionally red meat. Patient consumes fruits and veggies. Patient eat fried food twice a week.  Patient drinks water  and cranberry juice, soda occasionally   Exercise: Walk every other day.   Vaccine  Flu: n/a Tetanus:2023 Shingles: due  Colonoscopy: 2023, 5 mm polyp ascending colon. Pap smear: 2023  Mammogram:  Family history:  Colon cancer: yes  maternal grandfather Breast/prostate cancer:yes breast cancer paternal GM, maternal Aunt  Dentist: due Ophthalmology: every year   The 10-year ASCVD risk score (Arnett DK, et al., 2019) is: 16.6%   Values used to calculate the score:     Age: 37 years     Clincally relevant sex: Female     Is Non-Hispanic African American: Yes     Diabetic: Yes     Tobacco smoker: Yes     Systolic Blood Pressure: 128 mmHg     Is BP treated: Yes     HDL Cholesterol: 59.6 mg/dL     Total Cholesterol: 189 mg/dL   HPI   Past Medical History:  Diagnosis Date   Arthritis    Depression    Diabetes mellitus    Hip arthritis, left, post-SCFE 03/29/2011    Past Surgical History:  Procedure Laterality Date   ABDOMINAL HYSTERECTOMY     APPENDECTOMY     CESAREAN SECTION     COLONOSCOPY WITH PROPOFOL  N/A 02/05/2022   Procedure: COLONOSCOPY WITH PROPOFOL ;  Surgeon: Therisa Bi, MD;  Location: [facility] ENDOSCOPY;  Service: Gastroenterology;  Laterality: N/A;   FRACTURE SURGERY     JOINT REPLACEMENT     pins both hips   TIBIA IM NAIL INSERTION Right 05/15/2012   Procedure: Open Reduction Internal Fixation Right Tibia;  Surgeon: Fonda SHAUNNA Olmsted, MD;  Location: [facility] OR;  Service: Orthopedics;  Laterality: Right;   TONSILLECTOMY     TOTAL HIP  ARTHROPLASTY  03/29/2011   Procedure: TOTAL HIP ARTHROPLASTY;  Surgeon: Fonda SHAUNNA Olmsted;  Location: [facility] OR;  Service: Orthopedics;  Laterality: Left;  with removal of retained hardware (knowles pins)    Family History  Problem Relation Age of Onset   Breast cancer Maternal Aunt     Social History   Socioeconomic History   Marital status: Single    Spouse name: Not on file   Number of children: Not on file   Years of education: Not on file   Highest education level: Not on file  Occupational History   Not on file  Tobacco Use   Smoking status: Every Day    Types: Cigars    Last attempt to quit: 03/24/2011    Years since quitting: 12.5   Smokeless tobacco: Never   Tobacco comments:    Trying to quit. There are days she does not smoke at all. She smokes two cigarettes a week.   Vaping Use   Vaping status: Never Used  Substance and Sexual Activity   Alcohol use: No    Comment: rarely   Drug use: No   Sexual activity: Never  Other Topics Concern   Not on file  Social History Narrative   Not on file   Social Drivers of Health   Financial  Resource Strain: Not on file  Food Insecurity: Not on file  Transportation Needs: Not on file  Physical Activity: Not on file  Stress: Not on file  Social Connections: Not on file  Intimate Partner Violence: Not on file     Outpatient Medications Prior to Visit  Medication Sig Dispense Refill   blood glucose meter kit and supplies Dispense based on patient and insurance preference. Use up to four times daily as directed. (FOR ICD-10 E10.9, E11.9). 1 each 0   ibuprofen  (ADVIL ,MOTRIN ) 600 MG tablet Take 1 tablet (600 mg total) by mouth every 6 (six) hours as needed. 30 tablet 0   meloxicam (MOBIC) 7.5 MG tablet Take 7.5 mg by mouth daily.     methylPREDNISolone  (MEDROL  DOSEPAK) 4 MG TBPK tablet Take as directed 21 each 0   naproxen  (NAPROSYN ) 500 MG tablet Take 500 mg by mouth 2 (two) times daily as needed.     nitrofurantoin ,  macrocrystal-monohydrate, (MACROBID ) 100 MG capsule Take 1 capsule (100 mg total) by mouth 2 (two) times daily. Take with food. 10 capsule 0   glipiZIDE  (GLUCOTROL ) 5 MG tablet Take 1 tablet (5 mg total) by mouth 2 (two) times daily before a meal. 180 tablet 1   omeprazole  (PRILOSEC) 40 MG capsule TAKE 1 CAPSULE (40 MG TOTAL) BY MOUTH DAILY. 90 capsule 1   rosuvastatin  (CRESTOR ) 10 MG tablet TAKE 1 TABLET BY MOUTH EVERY DAY 90 tablet 3   tirzepatide  (MOUNJARO ) 7.5 MG/0.5ML Pen Inject 7.5 mg into the skin once a week. 6 mL 2   losartan -hydrochlorothiazide  (HYZAAR) 50-12.5 MG tablet TAKE 1 TABLET BY MOUTH EVERY DAY 90 tablet 1   No facility-administered medications prior to visit.    Allergies  Allergen Reactions   Metformin And Related     GI Upset   Penicillins Hives    ROS Review of Systems Negative unless indicated in HPI.    Objective:    Physical Exam Constitutional:      Appearance: Normal appearance. She is normal weight.  HENT:     Head: Normocephalic.     Right Ear: Tympanic membrane normal.     Left Ear: Tympanic membrane normal.     Mouth/Throat:     Mouth: Mucous membranes are moist.  Eyes:     Extraocular Movements: Extraocular movements intact.     Conjunctiva/sclera: Conjunctivae normal.     Pupils: Pupils are equal, round, and reactive to light.  Neck:     Thyroid : No thyroid  mass or thyroid  tenderness.  Cardiovascular:     Rate and Rhythm: Normal rate and regular rhythm.     Pulses: Normal pulses.     Heart sounds: Normal heart sounds. No murmur heard. Pulmonary:     Effort: Pulmonary effort is normal.     Breath sounds: Normal breath sounds.  Chest:     Chest wall: No mass.  Breasts:    Right: Normal. No nipple discharge.     Left: Normal. No nipple discharge.  Abdominal:     General: Bowel sounds are normal.     Palpations: Abdomen is soft. There is no mass.     Tenderness: There is no abdominal tenderness. There is no rebound.   Musculoskeletal:        General: No swelling.     Cervical back: Neck supple. No tenderness.     Right lower leg: No edema.     Left lower leg: No edema.  Lymphadenopathy:     Upper Body:  Right upper body: No axillary adenopathy.     Left upper body: No axillary adenopathy.  Skin:    Findings: No bruising, erythema or rash.  Neurological:     General: No focal deficit present.     Mental Status: She is alert and oriented to person, place, and time. Mental status is at baseline.  Psychiatric:        Mood and Affect: Mood normal.        Behavior: Behavior normal.        Thought Content: Thought content normal.        Judgment: Judgment normal.     BP 128/80   Pulse 87   Temp 98.2 F (36.8 C)   Ht 5' 4 (1.626 m)   Wt 290 lb 3.2 oz (131.6 kg)   LMP 07/17/2002 (Approximate)   SpO2 97%   BMI 49.81 kg/m  Wt Readings from Last 3 Encounters:  10/11/23 290 lb 3.2 oz (131.6 kg)  05/26/23 293 lb 6.4 oz (133.1 kg)  02/25/23 296 lb (134.3 kg)     Health Maintenance  Topic Date Due   FOOT EXAM  Never done   HIV Screening  Never done   Diabetic kidney evaluation - Urine ACR  Never done   Pneumococcal Vaccine 1-28 Years old (1 of 2 - PCV) Never done   Hepatitis B Vaccines (1 of 3 - 19+ 3-dose series) Never done   Zoster Vaccines- Shingrix (1 of 2) Never done   COVID-19 Vaccine (4 - 2024-25 season) 10/26/2023 (Originally 11/21/2022)   INFLUENZA VACCINE  10/21/2023   HEMOGLOBIN A1C  12/17/2023   MAMMOGRAM  12/18/2023   Diabetic kidney evaluation - eGFR measurement  02/25/2024   OPHTHALMOLOGY EXAM  07/10/2024   Cervical Cancer Screening (HPV/Pap Cotest)  10/22/2024   Colonoscopy  02/05/2029   DTaP/Tdap/Td (2 - Td or Tdap) 10/23/2031   Hepatitis C Screening  Completed   HPV VACCINES  Aged Out   Meningococcal B Vaccine  Aged Out       Topic Date Due   Hepatitis B Vaccines (1 of 3 - 19+ 3-dose series) Never done    Lab Results  Component Value Date   TSH 1.81  07/09/2022   Lab Results  Component Value Date   WBC 5.5 02/25/2023   HGB 12.8 02/25/2023   HCT 36.5 02/25/2023   MCV 95.1 02/25/2023   PLT 275.0 02/25/2023   Lab Results  Component Value Date   NA 142 02/25/2023   K 4.0 02/25/2023   CO2 29 02/25/2023   GLUCOSE 181 (H) 02/25/2023   BUN 19 02/25/2023   CREATININE 1.01 02/25/2023   BILITOT 0.7 06/16/2023   ALKPHOS 139 (H) 06/16/2023   AST 11 06/16/2023   ALT 14 06/16/2023   PROT 6.8 06/16/2023   ALBUMIN 3.8 06/16/2023   CALCIUM  8.7 02/25/2023   EGFR 73 10/23/2021   GFR 63.99 02/25/2023   Lab Results  Component Value Date   CHOL 189 02/25/2023   Lab Results  Component Value Date   HDL 59.60 02/25/2023   Lab Results  Component Value Date   LDLCALC 106 (H) 02/25/2023   Lab Results  Component Value Date   TRIG 116.0 02/25/2023   Lab Results  Component Value Date   CHOLHDL 3 02/25/2023   Lab Results  Component Value Date   HGBA1C 8.3 (H) 06/16/2023      Assessment & Plan:  Annual physical exam Assessment & Plan: Encouraged patient to consume a balanced  diet and regular exercise regimen. -Advised to see an eye doctor and dentist annually.  -Uptodate on pap, tdap, coloscopy.  -Want to get shingles vaccine at the pharmacy.  -Mammogram ordered. - The 10-year ASCVD risk score (Arnett DK, et al., 2019) is: 16.6%, Will increase rosuvastatin  from 10 to 20 mg daily.       Type 2 diabetes mellitus with hyperglycemia, without long-term current use of insulin  (HCC) -     Microalbumin / creatinine urine ratio -     Hemoglobin A1c -     Basic metabolic panel with GFR  Elevated liver enzymes -     Hepatic function panel  Hyperlipidemia, unspecified hyperlipidemia type -     Lipid panel  Screening mammogram, encounter for -     3D Screening Mammogram, Left and Right; Future  Other orders -     Losartan  Potassium-HCTZ; Take 1 tablet by mouth daily.  Dispense: 90 tablet; Refill: 3 -     glipiZIDE ; Take 1  tablet (5 mg total) by mouth 2 (two) times daily before a meal.  Dispense: 180 tablet; Refill: 3 -     Omeprazole ; Take 1 capsule (40 mg total) by mouth daily.  Dispense: 90 capsule; Refill: 1 -     Rosuvastatin  Calcium ; Take 1 tablet (20 mg total) by mouth daily.  Dispense: 90 tablet; Refill: 3 -     Tirzepatide ; Inject 10 mg into the skin once a week.  Dispense: 6 mL; Refill: 3    Follow-up: Return in about 4 months (around 02/11/2024) for chronic management.   Shaguana Love, NP

## 2023-10-11 NOTE — Assessment & Plan Note (Signed)
 Encouraged patient to consume a balanced diet and regular exercise regimen. -Advised to see an eye doctor and dentist annually.  -Uptodate on pap, tdap, coloscopy.  -Want to get shingles vaccine at the pharmacy.  -Mammogram ordered. - The 10-year ASCVD risk score (Arnett DK, et al., 2019) is: 16.6%, Will increase rosuvastatin  from 10 to 20 mg daily.

## 2023-10-11 NOTE — Patient Instructions (Signed)
 Preventive Care 16-53 Years Old, Female  Preventive care refers to lifestyle choices and visits with your health care provider that can promote health and wellness. Preventive care visits are also called wellness exams.  What can I expect for my preventive care visit?  Counseling  Your health care provider may ask you questions about your:  Medical history, including:  Past medical problems.  Family medical history.  Pregnancy history.  Current health, including:  Menstrual cycle.  Method of birth control.  Emotional well-being.  Home life and relationship well-being.  Sexual activity and sexual health.  Lifestyle, including:  Alcohol, nicotine or tobacco, and drug use.  Access to firearms.  Diet, exercise, and sleep habits.  Work and work Astronomer.  Sunscreen use.  Safety issues such as seatbelt and bike helmet use.  Physical exam  Your health care provider will check your:  Height and weight. These may be used to calculate your BMI (body mass index). BMI is a measurement that tells if you are at a healthy weight.  Waist circumference. This measures the distance around your waistline. This measurement also tells if you are at a healthy weight and may help predict your risk of certain diseases, such as type 2 diabetes and high blood pressure.  Heart rate and blood pressure.  Body temperature.  Skin for abnormal spots.  What immunizations do I need?    Vaccines are usually given at various ages, according to a schedule. Your health care provider will recommend vaccines for you based on your age, medical history, and lifestyle or other factors, such as travel or where you work.  What tests do I need?  Screening  Your health care provider may recommend screening tests for certain conditions. This may include:  Lipid and cholesterol levels.  Diabetes screening. This is done by checking your blood sugar (glucose) after you have not eaten for a while (fasting).  Pelvic exam and Pap test.  Hepatitis B test.  Hepatitis C  test.  HIV (human immunodeficiency virus) test.  STI (sexually transmitted infection) testing, if you are at risk.  Lung cancer screening.  Colorectal cancer screening.  Mammogram. Talk with your health care provider about when you should start having regular mammograms. This may depend on whether you have a family history of breast cancer.  BRCA-related cancer screening. This may be done if you have a family history of breast, ovarian, tubal, or peritoneal cancers.  Bone density scan. This is done to screen for osteoporosis.  Talk with your health care provider about your test results, treatment options, and if necessary, the need for more tests.  Follow these instructions at home:  Eating and drinking    Eat a diet that includes fresh fruits and vegetables, whole grains, lean protein, and low-fat dairy products.  Take vitamin and mineral supplements as recommended by your health care provider.  Do not drink alcohol if:  Your health care provider tells you not to drink.  You are pregnant, may be pregnant, or are planning to become pregnant.  If you drink alcohol:  Limit how much you have to 0-1 drink a day.  Know how much alcohol is in your drink. In the [place] one drink equals one 12 oz bottle of beer (355 mL), one 5 oz glass of wine (148 mL), or one 1 oz glass of hard liquor (44 mL).  Lifestyle  Brush your teeth every morning and night with fluoride toothpaste. Floss one time each day.  Exercise for at least  30 minutes 5 or more days each week.  Do not use any products that contain nicotine or tobacco. These products include cigarettes, chewing tobacco, and vaping devices, such as e-cigarettes. If you need help quitting, ask your health care provider.  Do not use drugs.  If you are sexually active, practice safe sex. Use a condom or other form of protection to prevent STIs.  If you do not wish to become pregnant, use a form of birth control. If you plan to become pregnant, see your health care provider for a  prepregnancy visit.  Take aspirin only as told by your health care provider. Make sure that you understand how much to take and what form to take. Work with your health care provider to find out whether it is safe and beneficial for you to take aspirin daily.  Find healthy ways to manage stress, such as:  Meditation, yoga, or listening to music.  Journaling.  Talking to a trusted person.  Spending time with friends and family.  Minimize exposure to UV radiation to reduce your risk of skin cancer.  Safety  Always wear your seat belt while driving or riding in a vehicle.  Do not drive:  If you have been drinking alcohol. Do not ride with someone who has been drinking.  When you are tired or distracted.  While texting.  If you have been using any mind-altering substances or drugs.  Wear a helmet and other protective equipment during sports activities.  If you have firearms in your house, make sure you follow all gun safety procedures.  Seek help if you have been physically or sexually abused.  What's next?  Visit your health care provider once a year for an annual wellness visit.  Ask your health care provider how often you should have your eyes and teeth checked.  Stay up to date on all vaccines.  This information is not intended to replace advice given to you by your health care provider. Make sure you discuss any questions you have with your health care provider.  Document Revised: 09/03/2020 Document Reviewed: 09/03/2020  Elsevier Patient Education  2024 [employer].

## 2023-10-12 LAB — HEPATIC FUNCTION PANEL
ALT: 12 U/L (ref 0–35)
AST: 13 U/L (ref 0–37)
Albumin: 4 g/dL (ref 3.5–5.2)
Alkaline Phosphatase: 129 U/L — ABNORMAL HIGH (ref 39–117)
Bilirubin, Direct: 0.1 mg/dL (ref 0.0–0.3)
Total Bilirubin: 0.8 mg/dL (ref 0.2–1.2)
Total Protein: 7.1 g/dL (ref 6.0–8.3)

## 2023-10-12 LAB — BASIC METABOLIC PANEL WITH GFR
BUN: 15 mg/dL (ref 6–23)
CO2: 29 meq/L (ref 19–32)
Calcium: 9.1 mg/dL (ref 8.4–10.5)
Chloride: 104 meq/L (ref 96–112)
Creatinine, Ser: 1.17 mg/dL (ref 0.40–1.20)
GFR: 53.4 mL/min — ABNORMAL LOW (ref >60.00)
Glucose, Bld: 97 mg/dL (ref 70–99)
Potassium: 4.6 meq/L (ref 3.5–5.1)
Sodium: 141 meq/L (ref 135–145)

## 2023-10-12 LAB — LIPID PANEL
Cholesterol: 183 mg/dL (ref 0–200)
HDL: 58.1 mg/dL (ref >39.00)
LDL Cholesterol: 93 mg/dL (ref 0–99)
NonHDL: 124.52
Total CHOL/HDL Ratio: 3
Triglycerides: 157 mg/dL — ABNORMAL HIGH (ref 0.0–149.0)
VLDL: 31.4 mg/dL (ref 0.0–40.0)

## 2023-10-12 LAB — MICROALBUMIN / CREATININE URINE RATIO
Creatinine,U: 158.4 mg/dL
Microalb Creat Ratio: 14.3 mg/g (ref 0.0–30.0)
Microalb, Ur: 2.3 mg/dL — ABNORMAL HIGH (ref 0.0–1.9)

## 2023-10-12 LAB — HEMOGLOBIN A1C: Hgb A1c MFr Bld: 8.4 % — ABNORMAL HIGH (ref 4.6–6.5)

## 2023-11-08 ENCOUNTER — Encounter: Payer: Self-pay | Admitting: Nurse Practitioner

## 2024-01-30 ENCOUNTER — Telehealth: Payer: Self-pay | Admitting: Nurse Practitioner

## 2024-01-30 DIAGNOSIS — R748 Abnormal levels of other serum enzymes: Secondary | ICD-10-CM

## 2024-01-30 DIAGNOSIS — E1165 Type 2 diabetes mellitus with hyperglycemia: Secondary | ICD-10-CM

## 2024-01-30 DIAGNOSIS — E785 Hyperlipidemia, unspecified: Secondary | ICD-10-CM

## 2024-01-31 ENCOUNTER — Telehealth: Payer: Self-pay

## 2024-01-31 NOTE — Telephone Encounter (Signed)
 Patient was identified as falling into the [place] Measure - Diabetes.   Patient was: Appointment already scheduled for:  labs 02/10/24 and with Chelsea Aurora, NP 02/14/24.

## 2024-02-07 DIAGNOSIS — E119 Type 2 diabetes mellitus without complications: Secondary | ICD-10-CM | POA: Diagnosis not present

## 2024-02-07 DIAGNOSIS — S92902A Unspecified fracture of left foot, initial encounter for closed fracture: Secondary | ICD-10-CM | POA: Diagnosis not present

## 2024-02-07 DIAGNOSIS — S93402A Sprain of unspecified ligament of left ankle, initial encounter: Secondary | ICD-10-CM | POA: Diagnosis not present

## 2024-02-07 DIAGNOSIS — Z7984 Long term (current) use of oral hypoglycemic drugs: Secondary | ICD-10-CM | POA: Diagnosis not present

## 2024-02-07 DIAGNOSIS — Z90710 Acquired absence of both cervix and uterus: Secondary | ICD-10-CM | POA: Diagnosis not present

## 2024-02-07 DIAGNOSIS — Z880 Allergy status to penicillin: Secondary | ICD-10-CM | POA: Diagnosis not present

## 2024-02-07 DIAGNOSIS — Z87891 Personal history of nicotine dependence: Secondary | ICD-10-CM | POA: Diagnosis not present

## 2024-02-07 DIAGNOSIS — S92002A Unspecified fracture of left calcaneus, initial encounter for closed fracture: Secondary | ICD-10-CM | POA: Diagnosis not present

## 2024-02-08 ENCOUNTER — Telehealth: Payer: Self-pay | Admitting: Podiatry

## 2024-02-08 NOTE — Telephone Encounter (Signed)
 Patient is requesting an MRI

## 2024-02-08 NOTE — Telephone Encounter (Signed)
 I spoke with the patient and relayed the message.

## 2024-02-09 DIAGNOSIS — S99912A Unspecified injury of left ankle, initial encounter: Secondary | ICD-10-CM | POA: Diagnosis not present

## 2024-02-10 ENCOUNTER — Other Ambulatory Visit

## 2024-02-10 ENCOUNTER — Ambulatory Visit: Payer: Self-pay | Admitting: Nurse Practitioner

## 2024-02-10 DIAGNOSIS — E785 Hyperlipidemia, unspecified: Secondary | ICD-10-CM

## 2024-02-10 DIAGNOSIS — R748 Abnormal levels of other serum enzymes: Secondary | ICD-10-CM

## 2024-02-10 DIAGNOSIS — E1165 Type 2 diabetes mellitus with hyperglycemia: Secondary | ICD-10-CM

## 2024-02-10 LAB — MICROALBUMIN / CREATININE URINE RATIO
Creatinine,U: 111.5 mg/dL
Microalb Creat Ratio: 15.7 mg/g (ref 0.0–30.0)
Microalb, Ur: 1.8 mg/dL (ref 0.0–1.9)

## 2024-02-10 LAB — BASIC METABOLIC PANEL WITH GFR
BUN: 20 mg/dL (ref 6–23)
CO2: 28 meq/L (ref 19–32)
Calcium: 8.8 mg/dL (ref 8.4–10.5)
Chloride: 107 meq/L (ref 96–112)
Creatinine, Ser: 1.17 mg/dL (ref 0.40–1.20)
GFR: 53.28 mL/min — ABNORMAL LOW (ref >60.00)
Glucose, Bld: 86 mg/dL (ref 70–99)
Potassium: 4 meq/L (ref 3.5–5.1)
Sodium: 141 meq/L (ref 135–145)

## 2024-02-10 LAB — LIPID PANEL
Cholesterol: 148 mg/dL (ref 0–200)
HDL: 53.7 mg/dL (ref >39.00)
LDL Cholesterol: 73 mg/dL (ref 0–99)
NonHDL: 94.71
Total CHOL/HDL Ratio: 3
Triglycerides: 107 mg/dL (ref 0.0–149.0)
VLDL: 21.4 mg/dL (ref 0.0–40.0)

## 2024-02-10 LAB — HEMOGLOBIN A1C: Hgb A1c MFr Bld: 7.7 % — ABNORMAL HIGH (ref 4.6–6.5)

## 2024-02-10 LAB — HEPATIC FUNCTION PANEL
ALT: 10 U/L (ref 0–35)
AST: 14 U/L (ref 0–37)
Albumin: 3.9 g/dL (ref 3.5–5.2)
Alkaline Phosphatase: 124 U/L — ABNORMAL HIGH (ref 39–117)
Bilirubin, Direct: 0.1 mg/dL (ref 0.0–0.3)
Total Bilirubin: 0.8 mg/dL (ref 0.2–1.2)
Total Protein: 7 g/dL (ref 6.0–8.3)

## 2024-02-10 NOTE — Progress Notes (Signed)
 Will review at [facility] on 02/14/24

## 2024-02-14 ENCOUNTER — Ambulatory Visit (INDEPENDENT_AMBULATORY_CARE_PROVIDER_SITE_OTHER): Admitting: Nurse Practitioner

## 2024-02-14 ENCOUNTER — Encounter: Payer: Self-pay | Admitting: Nurse Practitioner

## 2024-02-14 VITALS — BP 118/72 | HR 89 | Temp 98.1°F | Ht 64.0 in | Wt 280.0 lb

## 2024-02-14 DIAGNOSIS — K219 Gastro-esophageal reflux disease without esophagitis: Secondary | ICD-10-CM | POA: Diagnosis not present

## 2024-02-14 DIAGNOSIS — I10 Essential (primary) hypertension: Secondary | ICD-10-CM | POA: Diagnosis not present

## 2024-02-14 DIAGNOSIS — E119 Type 2 diabetes mellitus without complications: Secondary | ICD-10-CM | POA: Diagnosis not present

## 2024-02-14 DIAGNOSIS — R945 Abnormal results of liver function studies: Secondary | ICD-10-CM | POA: Diagnosis not present

## 2024-02-14 DIAGNOSIS — Z7984 Long term (current) use of oral hypoglycemic drugs: Secondary | ICD-10-CM | POA: Diagnosis not present

## 2024-02-14 DIAGNOSIS — K5900 Constipation, unspecified: Secondary | ICD-10-CM | POA: Diagnosis not present

## 2024-02-14 DIAGNOSIS — E785 Hyperlipidemia, unspecified: Secondary | ICD-10-CM

## 2024-02-14 DIAGNOSIS — Z7985 Long-term (current) use of injectable non-insulin antidiabetic drugs: Secondary | ICD-10-CM

## 2024-02-14 DIAGNOSIS — R42 Dizziness and giddiness: Secondary | ICD-10-CM | POA: Diagnosis not present

## 2024-02-14 DIAGNOSIS — Z1231 Encounter for screening mammogram for malignant neoplasm of breast: Secondary | ICD-10-CM

## 2024-02-14 MED ORDER — ROSUVASTATIN CALCIUM 20 MG PO TABS: 20.0000 mg | ORAL_TABLET | Freq: Every day | ORAL | 3 refills | Status: AC

## 2024-02-14 MED ORDER — LOSARTAN POTASSIUM-HCTZ 50-12.5 MG PO TABS
1.0000 | ORAL_TABLET | Freq: Every day | ORAL | 3 refills | Status: AC
Start: 2024-02-14 — End: ?

## 2024-02-14 MED ORDER — GLIPIZIDE 5 MG PO TABS
5.0000 mg | ORAL_TABLET | Freq: Two times a day (BID) | ORAL | 3 refills | Status: AC
Start: 2024-02-14 — End: ?

## 2024-02-14 MED ORDER — TIRZEPATIDE 12.5 MG/0.5ML ~~LOC~~ SOAJ: 12.5000 mg | SUBCUTANEOUS | 3 refills | Status: AC

## 2024-02-14 MED ORDER — OMEPRAZOLE 40 MG PO CPDR: 40.0000 mg | DELAYED_RELEASE_CAPSULE | Freq: Every day | ORAL | 1 refills | Status: AC

## 2024-02-14 NOTE — Assessment & Plan Note (Signed)
 Chronic on Mounjaro  and glipizide . Lab Results  Component Value Date   HGBA1C 7.7 (H) 02/10/2024  - Con

## 2024-02-14 NOTE — Progress Notes (Signed)
 Established Patient Office Visit  Subjective:  Patient ID: Jessica Richardson, female    DOB: [birth year]  Age: 53 y.o. MRN: [id number]  CC:  Chief Complaint  Patient presents with   Medical Management of Chronic Issues   Discussed the use of AI scribe software for clinical note transcription with the patient, who gave verbal consent to proceed.  History of Present Illness   Discussed the use of AI scribe software for clinical note transcription with the patient, who gave verbal consent to proceed.  History of Present Illness   Jessica Richardson is a 53 year old female presents for  routine follow up.  She recently sustained a foot fracture after tripping on uneven pavement and is followed by orthopedics.  Her type 2 diabetes is improving. She takes Mounjaro  and is trying to limit sweets and fried foods and increase vegetables. Her appetite is suppressed by her diabetes medication and she is eating less. Lab Results  Component Value Date   HGBA1C 7.7 (H) 02/10/2024   HGBA1C 8.4 (H) 10/11/2023   HGBA1C 8.3 (H) 06/16/2023    She takes rosuvastatin  for cholesterol. Recent labs showed mildly elevated alkaline phosphatase at 126 to 135 Hepatic Function Panel     Component Value Date/Time   PROT 7.0 02/10/2024 1106   ALBUMIN 3.9 02/10/2024 1106   AST 14 02/10/2024 1106   ALT 10 02/10/2024 1106   ALKPHOS 124 (H) 02/10/2024 1106   BILITOT 0.8 02/10/2024 1106   BILIDIR 0.1 02/10/2024 1106   She has had three brief episodes of dizziness when standing quickly, including on the day of her fall, without numbness, tingling, or vision changes. The dizziness remains for few seconds. No dizziness at present   She uses omeprazole  as needed for reflux. She smokes about two cigarettes per week and is working toward quitting. Her bowel movements are regular with a stool softener and occasional Medalex.      Past Medical History:  Diagnosis Date   Arthritis    Depression    Diabetes  mellitus    Hip arthritis, left, post-SCFE 03/29/2011    Past Surgical History:  Procedure Laterality Date   ABDOMINAL HYSTERECTOMY     APPENDECTOMY     CESAREAN SECTION     COLONOSCOPY WITH PROPOFOL  N/A 02/05/2022   Procedure: COLONOSCOPY WITH PROPOFOL ;  Surgeon: Therisa Bi, MD;  Location: [facility] ENDOSCOPY;  Service: Gastroenterology;  Laterality: N/A;   FRACTURE SURGERY     JOINT REPLACEMENT     pins both hips   TIBIA IM NAIL INSERTION Right 05/15/2012   Procedure: Open Reduction Internal Fixation Right Tibia;  Surgeon: Fonda SHAUNNA Olmsted, MD;  Location: [facility] OR;  Service: Orthopedics;  Laterality: Right;   TONSILLECTOMY     TOTAL HIP ARTHROPLASTY  03/29/2011   Procedure: TOTAL HIP ARTHROPLASTY;  Surgeon: Fonda SHAUNNA Olmsted;  Location: [facility] OR;  Service: Orthopedics;  Laterality: Left;  with removal of retained hardware (knowles pins)    Family History  Problem Relation Age of Onset   Breast cancer Maternal Aunt     Social History   Socioeconomic History   Marital status: Single    Spouse name: Not on file   Number of children: Not on file   Years of education: Not on file   Highest education level: Not on file  Occupational History   Not on file  Tobacco Use   Smoking status: Every Day    Types: Cigars    Last attempt to quit:  03/24/2011    Years since quitting: 12.9   Smokeless tobacco: Never   Tobacco comments:    Trying to quit. She smokes two cigarettes a week.   Vaping Use   Vaping status: Never Used  Substance and Sexual Activity   Alcohol use: No    Comment: rarely   Drug use: No   Sexual activity: Never  Other Topics Concern   Not on file  Social History Narrative   Not on file   Social Drivers of Health   Financial Resource Strain: Not on file  Food Insecurity: Not on file  Transportation Needs: Not on file  Physical Activity: Not on file  Stress: Not on file  Social Connections: Not on file  Intimate Partner Violence: Not on file     Outpatient  Medications Prior to Visit  Medication Sig Dispense Refill   blood glucose meter kit and supplies Dispense based on patient and insurance preference. Use up to four times daily as directed. (FOR ICD-10 E10.9, E11.9). 1 each 0   ibuprofen  (ADVIL ,MOTRIN ) 600 MG tablet Take 1 tablet (600 mg total) by mouth every 6 (six) hours as needed. 30 tablet 0   methylPREDNISolone  (MEDROL  DOSEPAK) 4 MG TBPK tablet Take as directed 21 each 0   nitrofurantoin , macrocrystal-monohydrate, (MACROBID ) 100 MG capsule Take 1 capsule (100 mg total) by mouth 2 (two) times daily. Take with food. 10 capsule 0   glipiZIDE  (GLUCOTROL ) 5 MG tablet Take 1 tablet (5 mg total) by mouth 2 (two) times daily before a meal. 180 tablet 3   losartan -hydrochlorothiazide  (HYZAAR) 50-12.5 MG tablet Take 1 tablet by mouth daily. 90 tablet 3   omeprazole  (PRILOSEC) 40 MG capsule Take 1 capsule (40 mg total) by mouth daily. 90 capsule 1   rosuvastatin  (CRESTOR ) 20 MG tablet Take 1 tablet (20 mg total) by mouth daily. 90 tablet 3   tirzepatide  (MOUNJARO ) 10 MG/0.5ML Pen Inject 10 mg into the skin once a week. 6 mL 3   meloxicam (MOBIC) 7.5 MG tablet Take 7.5 mg by mouth daily. (Patient not taking: Reported on 02/14/2024)     naproxen  (NAPROSYN ) 500 MG tablet Take 500 mg by mouth 2 (two) times daily as needed. (Patient not taking: Reported on 02/14/2024)     No facility-administered medications prior to visit.    Allergies  Allergen Reactions   Metformin And Related     GI Upset   Penicillins Hives    ROS Review of Systems Negative unless indicated in HPI.    Objective:    Physical Exam Constitutional:      Appearance: Normal appearance.  HENT:     Right Ear: Tympanic membrane normal.     Left Ear: Tympanic membrane normal.     Mouth/Throat:     Mouth: Mucous membranes are moist.  Eyes:     Conjunctiva/sclera: Conjunctivae normal.     Pupils: Pupils are equal, round, and reactive to light.  Cardiovascular:     Rate and  Rhythm: Normal rate and regular rhythm.     Pulses: Normal pulses.     Heart sounds: Normal heart sounds.  Pulmonary:     Effort: Pulmonary effort is normal.     Breath sounds: Normal breath sounds.  Abdominal:     General: Bowel sounds are normal.     Palpations: Abdomen is soft. There is no mass.     Tenderness: There is no guarding.  Musculoskeletal:     Cervical back: Normal range of motion. No tenderness.  Skin:    General: Skin is warm.     Findings: No bruising.  Neurological:     General: No focal deficit present.     Mental Status: She is alert and oriented to person, place, and time. Mental status is at baseline.  Psychiatric:        Mood and Affect: Mood normal.        Behavior: Behavior normal.        Thought Content: Thought content normal.        Judgment: Judgment normal.     BP 118/72   Pulse 89   Temp 98.1 F (36.7 C)   Ht 5' 4 (1.626 m)   Wt 280 lb (127 kg)   LMP 07/17/2002 (Approximate)   SpO2 97%   BMI 48.06 kg/m  Wt Readings from Last 3 Encounters:  02/14/24 280 lb (127 kg)  10/11/23 290 lb 3.2 oz (131.6 kg)  05/26/23 293 lb 6.4 oz (133.1 kg)     Health Maintenance  Topic Date Due   FOOT EXAM  Never done   HIV Screening  Never done   Pneumococcal Vaccine: 50+ Years (1 of 2 - PCV) Never done   Hepatitis B Vaccines 19-59 Average Risk (1 of 3 - 19+ 3-dose series) Never done   Zoster Vaccines- Shingrix (1 of 2) Never done   COVID-19 Vaccine (4 - 2025-26 season) 11/21/2023   Mammogram  12/18/2023   Influenza Vaccine  06/19/2024 (Originally 10/21/2023)   OPHTHALMOLOGY EXAM  07/10/2024   HEMOGLOBIN A1C  08/09/2024   Cervical Cancer Screening (HPV/Pap Cotest)  10/22/2024   Diabetic kidney evaluation - eGFR measurement  02/09/2025   Diabetic kidney evaluation - Urine ACR  02/09/2025   Colonoscopy  02/05/2029   DTaP/Tdap/Td (2 - Td or Tdap) 10/23/2031   Hepatitis C Screening  Completed   HPV VACCINES  Aged Out   Meningococcal B Vaccine  Aged Out        Topic Date Due   Hepatitis B Vaccines 19-59 Average Risk (1 of 3 - 19+ 3-dose series) Never done    Lab Results  Component Value Date   TSH 1.81 07/09/2022   Lab Results  Component Value Date   WBC 5.5 02/25/2023   HGB 12.8 02/25/2023   HCT 36.5 02/25/2023   MCV 95.1 02/25/2023   PLT 275.0 02/25/2023   Lab Results  Component Value Date   NA 141 02/10/2024   K 4.0 02/10/2024   CO2 28 02/10/2024   GLUCOSE 86 02/10/2024   BUN 20 02/10/2024   CREATININE 1.17 02/10/2024   BILITOT 0.8 02/10/2024   ALKPHOS 124 (H) 02/10/2024   AST 14 02/10/2024   ALT 10 02/10/2024   PROT 7.0 02/10/2024   ALBUMIN 3.9 02/10/2024   CALCIUM  8.8 02/10/2024   EGFR 73 10/23/2021   GFR 53.28 (L) 02/10/2024   Lab Results  Component Value Date   CHOL 148 02/10/2024   Lab Results  Component Value Date   HDL 53.70 02/10/2024   Lab Results  Component Value Date   LDLCALC 73 02/10/2024   Lab Results  Component Value Date   TRIG 107.0 02/10/2024   Lab Results  Component Value Date   CHOLHDL 3 02/10/2024   Lab Results  Component Value Date   HGBA1C 7.7 (H) 02/10/2024      Assessment & Plan:   Assessment & Plan Hypertension, unspecified type Blood pressure well-controlled. - Continue losartan /HCTZ 50 -12.5 mg Orders:   losartan -hydrochlorothiazide  (HYZAAR) 50-12.5 MG  tablet; Take 1 tablet by mouth daily. (Patient not taking: Reported on 02/14/2024)  Type 2 diabetes mellitus without complication, without long-term current use of insulin  (HCC) A1c improved from 8.4 to 7.7. Dietary changes implemented. - Increased Mounjaro  to 12 mg. - Sent prescription for updated Mounjaro  dosage. Orders:   tirzepatide  (MOUNJARO ) 12.5 MG/0.5ML Pen; Inject 12.5 mg into the skin once a week.   glipiZIDE  (GLUCOTROL ) 5 MG tablet; Take 1 tablet (5 mg total) by mouth 2 (two) times daily before a meal.  Hyperlipidemia, unspecified hyperlipidemia type Cholesterol and triglycerides  well-controlled. - Sent refill for rosuvastatin . Orders:   rosuvastatin  (CRESTOR ) 20 MG tablet; Take 1 tablet (20 mg total) by mouth daily. (Patient not taking: Reported on 02/14/2024)  Screening mammogram, encounter for  Orders:   MM 3D SCREENING MAMMOGRAM BILATERAL BREAST; Future  Abnormal results of liver function studies Elevated alkaline phosphatase, possible hepatic or bone origin. Differential includes fatty liver or bone-related causes. - Ordered liver ultrasound. Orders:   US  Abdomen Limited RUQ (LIVER/GB); Future  Morbid obesity (HCC) Managed with dietary changes. Mounjaro  increase to aid weight reduction. - Increased Mounjaro  to 12 mg.    Gastroesophageal reflux disease, unspecified whether esophagitis present Symptoms managed with dietary modifications and omeprazole  as needed. - Continue dietary modifications. - Use omeprazole  as needed.    Dizziness Intermittent dizziness.  No dizziness with present.  Denies any neurological symptoms.  Recent episode of URI - Advised to rise slowly. - She will let us  know if symptoms not improving.    Constipation, unspecified constipation type Managed with stool softeners and dietary modifications. - Continue current bowel regimen. - Encouraged dietary intake of roughage-rich foods.     Assessment & Plan       Right foot fracture Fracture managed by orthopedics. - Continue boot for four weeks. - Schedule follow-up x-ray with orthopedics.  General Health Maintenance Mammogram order placed. - Provided contact information for scheduling mammogram.       Follow-up: No follow-ups on file.   Imane Burrough, NP

## 2024-02-14 NOTE — Assessment & Plan Note (Addendum)
 Blood pressure well-controlled. - Continue losartan /HCTZ 50 -12.5 mg Orders:   losartan -hydrochlorothiazide  (HYZAAR) 50-12.5 MG tablet; Take 1 tablet by mouth daily. (Patient not taking: Reported on 02/14/2024)

## 2024-02-14 NOTE — Patient Instructions (Addendum)
 YOUR MAMMOGRAM IS DUE, PLEASE CALL AND GET THIS SCHEDULED! [facility] - call [phone]   Increase Mounjaro  from 10 to 12 mg.  Liver US  ordered

## 2024-02-14 NOTE — Assessment & Plan Note (Addendum)
 Cholesterol and triglycerides well-controlled. - Sent refill for rosuvastatin . Orders:   rosuvastatin  (CRESTOR ) 20 MG tablet; Take 1 tablet (20 mg total) by mouth daily. (Patient not taking: Reported on 02/14/2024)

## 2024-02-14 NOTE — Assessment & Plan Note (Addendum)
 A1c improved from 8.4 to 7.7. Dietary changes implemented. - Increased Mounjaro  to 12 mg. - Sent prescription for updated Mounjaro  dosage. Orders:   tirzepatide  (MOUNJARO ) 12.5 MG/0.5ML Pen; Inject 12.5 mg into the skin once a week.   glipiZIDE  (GLUCOTROL ) 5 MG tablet; Take 1 tablet (5 mg total) by mouth 2 (two) times daily before a meal.

## 2024-02-19 ENCOUNTER — Encounter: Payer: Self-pay | Admitting: Nurse Practitioner

## 2024-02-19 DIAGNOSIS — R945 Abnormal results of liver function studies: Secondary | ICD-10-CM | POA: Insufficient documentation

## 2024-02-19 DIAGNOSIS — K219 Gastro-esophageal reflux disease without esophagitis: Secondary | ICD-10-CM | POA: Insufficient documentation

## 2024-02-19 DIAGNOSIS — R42 Dizziness and giddiness: Secondary | ICD-10-CM | POA: Insufficient documentation

## 2024-02-19 NOTE — Assessment & Plan Note (Signed)
 Symptoms managed with dietary modifications and omeprazole  as needed. - Continue dietary modifications. - Use omeprazole  as needed.

## 2024-02-19 NOTE — Assessment & Plan Note (Signed)
 Elevated alkaline phosphatase, possible hepatic or bone origin. Differential includes fatty liver or bone-related causes. - Ordered liver ultrasound. Orders:   US  Abdomen Limited RUQ (LIVER/GB); Future

## 2024-02-19 NOTE — Assessment & Plan Note (Signed)
 Intermittent dizziness.  No dizziness with present.  Denies any neurological symptoms.  Recent episode of URI - Advised to rise slowly. - She will let us  know if symptoms not improving.

## 2024-02-19 NOTE — Assessment & Plan Note (Signed)
 Managed with dietary changes. Mounjaro  increase to aid weight reduction. - Increased Mounjaro  to 12 mg.

## 2024-02-23 ENCOUNTER — Ambulatory Visit: Admitting: Podiatry

## 2024-02-23 DIAGNOSIS — L600 Ingrowing nail: Secondary | ICD-10-CM

## 2024-02-23 DIAGNOSIS — L853 Xerosis cutis: Secondary | ICD-10-CM

## 2024-02-23 MED ORDER — AMMONIUM LACTATE 12 % EX LOTN: 1.0000 | TOPICAL_LOTION | CUTANEOUS | 0 refills | Status: AC | PRN

## 2024-02-23 NOTE — Progress Notes (Unsigned)
 Subjective:  Patient ID: Jessica Richardson, female    DOB: Mar 15, 1971,  MRN: [id number]  Chief Complaint  Patient presents with   Foot Pain    53 y.o. female presents with the above complaint.  Patient presents for left hallux medial border ingrown painful to touch is progressing and worse worse with ambulation is with pressure has not seen MRIs prior to seeing me she is diabetic A1c is controlled.  Wanted to discuss treatment options for this.  She has secondary complaint of dry skin.  Has not seen anyone else prior to seeing me for this.  She would like to discuss treatment options for dry skin she has tried over-the-counter option   Review of Systems: Negative except as noted in the HPI. Denies N/V/F/Ch.  Past Medical History:  Diagnosis Date   Arthritis    Depression    Diabetes mellitus    Hip arthritis, left, post-SCFE 03/29/2011    Current Outpatient Medications:    ammonium lactate  (AMLACTIN DAILY) 12 % lotion, Apply 1 Application topically as needed., Disp: 400 g, Rfl: 0   blood glucose meter kit and supplies, Dispense based on patient and insurance preference. Use up to four times daily as directed. (FOR ICD-10 E10.9, E11.9)., Disp: 1 each, Rfl: 0   glipiZIDE  (GLUCOTROL ) 5 MG tablet, Take 1 tablet (5 mg total) by mouth 2 (two) times daily before a meal., Disp: 180 tablet, Rfl: 3   ibuprofen  (ADVIL ,MOTRIN ) 600 MG tablet, Take 1 tablet (600 mg total) by mouth every 6 (six) hours as needed., Disp: 30 tablet, Rfl: 0   losartan -hydrochlorothiazide  (HYZAAR) 50-12.5 MG tablet, Take 1 tablet by mouth daily. (Patient not taking: Reported on 02/14/2024), Disp: 90 tablet, Rfl: 3   meloxicam (MOBIC) 7.5 MG tablet, Take 7.5 mg by mouth daily. (Patient not taking: Reported on 02/14/2024), Disp: , Rfl:    methylPREDNISolone  (MEDROL  DOSEPAK) 4 MG TBPK tablet, Take as directed, Disp: 21 each, Rfl: 0   naproxen  (NAPROSYN ) 500 MG tablet, Take 500 mg by mouth 2 (two) times daily as needed.  (Patient not taking: Reported on 02/14/2024), Disp: , Rfl:    nitrofurantoin , macrocrystal-monohydrate, (MACROBID ) 100 MG capsule, Take 1 capsule (100 mg total) by mouth 2 (two) times daily. Take with food., Disp: 10 capsule, Rfl: 0   omeprazole  (PRILOSEC) 40 MG capsule, Take 1 capsule (40 mg total) by mouth daily., Disp: 90 capsule, Rfl: 1   rosuvastatin  (CRESTOR ) 20 MG tablet, Take 1 tablet (20 mg total) by mouth daily. (Patient not taking: Reported on 02/14/2024), Disp: 90 tablet, Rfl: 3   tirzepatide  (MOUNJARO ) 12.5 MG/0.5ML Pen, Inject 12.5 mg into the skin once a week., Disp: 6 mL, Rfl: 3  Social History   Tobacco Use  Smoking Status Every Day   Types: Cigars   Last attempt to quit: 03/24/2011   Years since quitting: 12.9  Smokeless Tobacco Never  Tobacco Comments   Trying to quit. She smokes two cigarettes a week.     Allergies  Allergen Reactions   Metformin And Related     GI Upset   Penicillins Hives   Objective:  There were no vitals filed for this visit. There is no height or weight on file to calculate BMI. Constitutional Well developed. Well nourished.  Vascular Dorsalis pedis pulses palpable bilaterally. Posterior tibial pulses palpable bilaterally. Capillary refill normal to all digits.  No cyanosis or clubbing noted. Pedal hair growth normal.  Neurologic Normal speech. Oriented to person, place, and time. Epicritic sensation to  light touch grossly present bilaterally.  Dermatologic Painful ingrowing nail at medial nail borders of the hallux nail left. No other open wounds. Moderate xerosis noted bilateral feet no open wounds or lesion noted no skin fissure noted  Orthopedic: Normal joint ROM without pain or crepitus bilaterally. No visible deformities. No bony tenderness.   Radiographs: None Assessment:   1. Xerosis of skin   2. Ingrown left big toenail    Plan:  Patient was evaluated and treated and all questions answered.  I explained to the  patient the etiology of xerosis and various treatment options were extensively discussed.  I explained to the patient the importance of maintaining moisturization of the skin with application of over-the-counter lotion such as Eucerin or Luciderm.  Given that patient has failed over-the-counter options she would benefit from ammonium lactate  ammonium lactate  was sent to the pharmacy and instructed her to apply twice a day she states understanding   Ingrown Nail, left -Patient elects to proceed with minor surgery to remove ingrown toenail removal today. Consent reviewed and signed by patient. -Ingrown nail excised. See procedure note. -Educated on post-procedure care including soaking. Written instructions provided and reviewed. -Patient to follow up in 2 weeks for nail check.   Procedure: Excision of Ingrown Toenail Location: Left 1st toe medial nail borders. Anesthesia: Lidocaine  1% plain; 1.5 mL and Marcaine  0.5% plain; 1.5 mL, digital block. Skin Prep: Betadine. Dressing: Silvadene; telfa; dry, sterile, compression dressing. Technique: Following skin prep, the toe was exsanguinated and a tourniquet was secured at the base of the toe. The affected nail border was freed, split with a nail splitter, and excised. Chemical matrixectomy was then performed with phenol and irrigated out with alcohol. The tourniquet was then removed and sterile dressing applied. Disposition: Patient tolerated procedure well. Patient to return in 2 weeks for follow-up.   No follow-ups on file.

## 2024-02-24 ENCOUNTER — Ambulatory Visit
Admission: RE | Admit: 2024-02-24 | Discharge: 2024-02-24 | Disposition: A | Discharge status: Home / Self Care | Source: Ambulatory Visit | Attending: Nurse Practitioner | Admitting: Nurse Practitioner

## 2024-02-24 DIAGNOSIS — R945 Abnormal results of liver function studies: Secondary | ICD-10-CM

## 2024-02-24 DIAGNOSIS — K760 Fatty (change of) liver, not elsewhere classified: Secondary | ICD-10-CM | POA: Diagnosis not present

## 2024-02-24 NOTE — Telephone Encounter (Signed)
 completed

## 2024-02-27 ENCOUNTER — Ambulatory Visit: Payer: Self-pay | Admitting: Nurse Practitioner

## 2024-02-27 NOTE — Progress Notes (Signed)
 Please inform patient: The ultrasound of the liver showed fatty liver.  Would recommend exercise at least 150 minutes/week, Mediterranean style diet: Avoid sugar, refined carbs, fried food and alcohol. Continue Crestor  20 mg daily.

## 2024-02-28 NOTE — Progress Notes (Signed)
 Noted

## 2024-04-24 NOTE — Progress Notes (Signed)
 Jessica Richardson                                          MRN: [id number]   04/24/2024   The [facility] Quality Team Specialist reviewed this patient medical record for the purposes of chart review for care gap closure. The following were reviewed: chart review for care gap closure-breast cancer screening.    [facility] Quality Team

## 2024-06-14 ENCOUNTER — Ambulatory Visit: Admitting: Nurse Practitioner
